# Patient Record
Sex: Female | Born: 1970
Health system: Southern US, Community
[De-identification: ages and names within clinical notes are randomized; demographics above are authoritative.]

## PROBLEM LIST (undated history)

## (undated) DIAGNOSIS — G43909 Migraine, unspecified, not intractable, without status migrainosus: Secondary | ICD-10-CM

## (undated) DIAGNOSIS — J189 Pneumonia, unspecified organism: Secondary | ICD-10-CM

## (undated) DIAGNOSIS — J449 Chronic obstructive pulmonary disease, unspecified: Secondary | ICD-10-CM

## (undated) DIAGNOSIS — J939 Pneumothorax, unspecified: Secondary | ICD-10-CM

## (undated) DIAGNOSIS — N200 Calculus of kidney: Secondary | ICD-10-CM

## (undated) HISTORY — PX: ABDOMINAL HYSTERECTOMY: SHX81

## (undated) HISTORY — PX: LUNG SURGERY: SHX703

---

## 2004-07-09 ENCOUNTER — Other Ambulatory Visit: Payer: Self-pay

## 2004-07-09 ENCOUNTER — Emergency Department: Payer: Self-pay | Admitting: Emergency Medicine

## 2004-07-18 ENCOUNTER — Ambulatory Visit: Payer: Self-pay | Admitting: Internal Medicine

## 2005-05-11 ENCOUNTER — Emergency Department: Payer: Self-pay | Admitting: Unknown Physician Specialty

## 2005-10-08 ENCOUNTER — Emergency Department: Payer: Self-pay | Admitting: Emergency Medicine

## 2005-11-23 ENCOUNTER — Emergency Department: Payer: Self-pay | Admitting: Emergency Medicine

## 2007-10-09 ENCOUNTER — Emergency Department: Payer: Self-pay | Admitting: Emergency Medicine

## 2008-01-11 ENCOUNTER — Emergency Department: Payer: Self-pay | Admitting: Emergency Medicine

## 2008-03-16 ENCOUNTER — Emergency Department: Payer: Self-pay | Admitting: Emergency Medicine

## 2008-03-20 ENCOUNTER — Emergency Department: Payer: Self-pay | Admitting: Emergency Medicine

## 2008-08-02 ENCOUNTER — Emergency Department: Payer: Self-pay | Admitting: Emergency Medicine

## 2010-01-05 ENCOUNTER — Emergency Department: Payer: Self-pay | Admitting: Emergency Medicine

## 2010-04-13 ENCOUNTER — Ambulatory Visit: Payer: Self-pay | Admitting: Family Medicine

## 2010-06-07 ENCOUNTER — Emergency Department: Payer: Self-pay | Admitting: Internal Medicine

## 2011-03-05 ENCOUNTER — Emergency Department: Payer: Self-pay | Admitting: Emergency Medicine

## 2011-05-06 ENCOUNTER — Emergency Department: Payer: Self-pay | Admitting: Emergency Medicine

## 2011-09-11 ENCOUNTER — Emergency Department: Payer: Self-pay | Admitting: Emergency Medicine

## 2011-09-11 LAB — URINALYSIS, COMPLETE
Glucose,UR: NEGATIVE mg/dL (ref 0–75)
Ketone: NEGATIVE
Nitrite: NEGATIVE
Ph: 5 (ref 4.5–8.0)
Protein: 100
RBC,UR: 50 /HPF (ref 0–5)
Specific Gravity: 1.02 (ref 1.003–1.030)
Squamous Epithelial: 13
Transitional Epi: 2
WBC UR: 1524 /HPF (ref 0–5)

## 2011-09-11 LAB — COMPREHENSIVE METABOLIC PANEL
Albumin: 4 g/dL (ref 3.4–5.0)
Alkaline Phosphatase: 76 U/L (ref 50–136)
Anion Gap: 7 (ref 7–16)
BUN: 9 mg/dL (ref 7–18)
Bilirubin,Total: 0.3 mg/dL (ref 0.2–1.0)
Calcium, Total: 8.9 mg/dL (ref 8.5–10.1)
Chloride: 107 mmol/L (ref 98–107)
Co2: 26 mmol/L (ref 21–32)
Creatinine: 0.69 mg/dL (ref 0.60–1.30)
EGFR (African American): >60
EGFR (Non-African Amer.): >60
Glucose: 79 mg/dL (ref 65–99)
Osmolality: 277 (ref 275–301)
Potassium: 3.6 mmol/L (ref 3.5–5.1)
SGOT(AST): 22 U/L (ref 15–37)
SGPT (ALT): 13 U/L
Sodium: 140 mmol/L (ref 136–145)
Total Protein: 7.3 g/dL (ref 6.4–8.2)

## 2011-09-11 LAB — LIPASE, BLOOD: Lipase: 74 U/L (ref 73–393)

## 2011-09-11 LAB — CBC
HCT: 37.8 % (ref 35.0–47.0)
HGB: 12.3 g/dL (ref 12.0–16.0)
MCH: 33.2 pg (ref 26.0–34.0)
MCHC: 32.6 g/dL (ref 32.0–36.0)
MCV: 102 fL — ABNORMAL HIGH (ref 80–100)
Platelet: 232 10*3/uL (ref 150–440)
RBC: 3.71 10*6/uL — ABNORMAL LOW (ref 3.80–5.20)
RDW: 13.3 % (ref 11.5–14.5)
WBC: 7.5 10*3/uL (ref 3.6–11.0)

## 2011-09-11 LAB — TROPONIN I: Troponin-I: <0.02 ng/mL

## 2011-09-11 LAB — PREGNANCY, URINE: Pregnancy Test, Urine: NEGATIVE m[IU]/mL

## 2011-09-13 LAB — URINE CULTURE

## 2012-05-27 ENCOUNTER — Emergency Department: Payer: Self-pay | Admitting: Emergency Medicine

## 2012-06-02 ENCOUNTER — Emergency Department: Payer: Self-pay | Admitting: Emergency Medicine

## 2013-02-27 ENCOUNTER — Emergency Department: Payer: Self-pay | Admitting: Emergency Medicine

## 2013-09-08 ENCOUNTER — Emergency Department: Payer: Self-pay | Admitting: Emergency Medicine

## 2013-09-08 LAB — COMPREHENSIVE METABOLIC PANEL
Albumin: 4.2 g/dL (ref 3.4–5.0)
Alkaline Phosphatase: 86 U/L
Anion Gap: 2 — ABNORMAL LOW (ref 7–16)
BUN: 14 mg/dL (ref 7–18)
Bilirubin,Total: 0.3 mg/dL (ref 0.2–1.0)
Calcium, Total: 8.8 mg/dL (ref 8.5–10.1)
Chloride: 102 mmol/L (ref 98–107)
Co2: 31 mmol/L (ref 21–32)
Creatinine: 0.58 mg/dL — ABNORMAL LOW (ref 0.60–1.30)
EGFR (African American): >60
EGFR (Non-African Amer.): >60
Glucose: 95 mg/dL (ref 65–99)
Osmolality: 270 (ref 275–301)
Potassium: 3.9 mmol/L (ref 3.5–5.1)
SGOT(AST): 25 U/L (ref 15–37)
SGPT (ALT): 22 U/L (ref 12–78)
Sodium: 135 mmol/L — ABNORMAL LOW (ref 136–145)
Total Protein: 7.7 g/dL (ref 6.4–8.2)

## 2013-09-08 LAB — CBC
HCT: 41.3 % (ref 35.0–47.0)
HGB: 13.8 g/dL (ref 12.0–16.0)
MCH: 33.5 pg (ref 26.0–34.0)
MCHC: 33.4 g/dL (ref 32.0–36.0)
MCV: 100 fL (ref 80–100)
Platelet: 246 10*3/uL (ref 150–440)
RBC: 4.11 10*6/uL (ref 3.80–5.20)
RDW: 13.2 % (ref 11.5–14.5)
WBC: 8.1 10*3/uL (ref 3.6–11.0)

## 2013-09-08 LAB — LIPASE, BLOOD: Lipase: 94 U/L (ref 73–393)

## 2013-09-08 LAB — URINALYSIS, COMPLETE
Bacteria: NONE SEEN
Bilirubin,UR: NEGATIVE
Blood: NEGATIVE
Glucose,UR: NEGATIVE mg/dL (ref 0–75)
Hyaline Cast: 1
Leukocyte Esterase: NEGATIVE
Nitrite: NEGATIVE
Ph: 5 (ref 4.5–8.0)
Protein: NEGATIVE
RBC,UR: 4 /HPF (ref 0–5)
Specific Gravity: 1.029 (ref 1.003–1.030)
Squamous Epithelial: 2
WBC UR: 1 /HPF (ref 0–5)

## 2013-09-10 LAB — URINE CULTURE

## 2013-10-20 ENCOUNTER — Emergency Department: Payer: Self-pay | Admitting: Emergency Medicine

## 2013-12-07 ENCOUNTER — Emergency Department: Payer: Self-pay | Admitting: Emergency Medicine

## 2014-07-26 ENCOUNTER — Emergency Department: Payer: Self-pay | Admitting: Emergency Medicine

## 2014-12-31 ENCOUNTER — Emergency Department
Admission: EM | Admit: 2014-12-31 | Discharge: 2015-01-01 | Disposition: A | Discharge status: Home / Self Care | Payer: Self-pay | Attending: Emergency Medicine | Admitting: Emergency Medicine

## 2014-12-31 DIAGNOSIS — Y998 Other external cause status: Secondary | ICD-10-CM | POA: Insufficient documentation

## 2014-12-31 DIAGNOSIS — T63001A Toxic effect of unspecified snake venom, accidental (unintentional), initial encounter: Secondary | ICD-10-CM

## 2014-12-31 DIAGNOSIS — Y9389 Activity, other specified: Secondary | ICD-10-CM | POA: Insufficient documentation

## 2014-12-31 DIAGNOSIS — W5911XA Bitten by nonvenomous snake, initial encounter: Secondary | ICD-10-CM | POA: Insufficient documentation

## 2014-12-31 DIAGNOSIS — Y9289 Other specified places as the place of occurrence of the external cause: Secondary | ICD-10-CM | POA: Insufficient documentation

## 2014-12-31 DIAGNOSIS — S61432A Puncture wound without foreign body of left hand, initial encounter: Secondary | ICD-10-CM | POA: Insufficient documentation

## 2014-12-31 NOTE — ED Provider Notes (Signed)
[facility] Emergency Department Provider Note  ____________________________________________  Time seen:  12:10 AM  I have reviewed the triage vital signs and the nursing notes.   HISTORY  Chief Complaint Snake Bite     HPI Lindsay Friedman is a 44 y.o. female who suspects she was bitten by a snake this evening. Prior to arrival, she was outside of the trailer she lives in she dropped her cigarette. She reached down to pick it up and felt a strike on her hand. She pulled back immediately and reports she saw a snake move away quickly.  The patient was unable to identify what type of snake it may have been.  The patient notes that she has 2 small red areas on the dorsum of her left hand.    The patient became quite nervous after this event. She felt tightness in her chest.  Patient reports she had tetanus shot 2 years ago.  No past medical history on file.  There are no active problems to display for this patient.   No past surgical history on file.  No current outpatient prescriptions on file.  Allergies Review of patient's allergies indicates no known allergies.  No family history on file.  Social History Social History  Substance Use Topics  . Smoking status: Not on file  . Smokeless tobacco: Not on file  . Alcohol Use: Not on file    Review of Systems  Constitutional: Negative for fever. ENT: Negative for sore throat. Cardiovascular: Negative for chest pain. Respiratory: Negative for shortness of breath. Gastrointestinal: Negative for abdominal pain, vomiting and diarrhea. Genitourinary: Negative for dysuria. Musculoskeletal: No myalgias or injuries. Skin: Negative for rash. Neurological: Negative for headaches   10-point ROS otherwise negative.  ____________________________________________   PHYSICAL EXAM:  VITAL SIGNS: ED Triage Vitals  Enc Vitals Group     BP 12/31/14 2313 118/84 mmHg     Pulse Rate 12/31/14 2313 91   Resp 12/31/14 2313 20     Temp 12/31/14 2313 98.2 F (36.8 C)     Temp Source 12/31/14 2313 Oral     SpO2 12/31/14 2313 100 %     Weight 12/31/14 2313 110 lb (49.896 kg)     Height 12/31/14 2313 5\' 2"  (1.575 m)     Head Cir --      Peak Flow --      Pain Score --      Pain Loc --      Pain Edu? --      Excl. in GC? --     Constitutional: Alert and oriented. Well appearing and in no distress. ENT   Head: Normocephalic and atraumatic.   Nose: No congestion/rhinnorhea. Cardiovascular: Normal rate, regular rhythm, no murmur noted Respiratory:  Normal respiratory effort, no tachypnea.    Breath sounds are clear and equal bilaterally.  Gastrointestinal: Soft and nontender. No distention.  Back: No muscle spasm, no tenderness, no CVA tenderness. Musculoskeletal: No deformity noted. Nontender with normal range of motion in all extremities.  No noted edema, including no edema of the left hand. The left hand has full range of motion and, other than the 2 small breaks in skin noted in the skin exam, appears normal. Neurologic:  Normal speech and language. No gross focal neurologic deficits are appreciated.  Skin:  There are 2 small skin breaks on the dorsum of the left hand. These appear to be small fang marks or puncture marks that hole laterally, likely from the patient  quickly withdrawing her hand. There is no ongoing bleeding. Each.is about to millimeters in diameter. They are approximately 15 mm apart. There is a dime size area of swelling around these 2 skin breaks. There is no erythema. There is no other swelling to the dorsum of the hand. Psychiatric: Mood and affect are normal. Speech and behavior are normal.  ____________________________________________   ____________________________________________   INITIAL IMPRESSION / ASSESSMENT AND PLAN / ED COURSE  Pertinent labs & imaging results that were available during my care of the patient were reviewed by me and considered in my  medical decision making (see chart for details).  44 year old female with a likely snake bite to the dorsum of the left hand. Thebetween the likely fang marks is fairly small. The bite was extremely brief, with the patient withdrawing her hand quickly. There is minimal swelling just to the area where the thing marks are. I have a lower suspicion for envenomation. No antivenom is indicated at this time. I have reassured the patient. We will observe her  in the emergency department for 3 hours.  ----------------------------------------- 2:10 AM on 01/01/2015 -----------------------------------------  The patient's left hand appears well. There is no sign of envenomation. There is no swelling. The patient is eager to leave the emergency department this time. We have discussed precautions and reasons to return.     ____________________________________________   FINAL CLINICAL IMPRESSION(S) / ED DIAGNOSES  Final diagnoses:  Snake bite, accidental or unintentional, initial encounter   left hand    Darien Ramus, MD 01/01/15 [phone]

## 2014-12-31 NOTE — ED Notes (Signed)
Reports snake bite to left hand.

## 2015-01-01 NOTE — Discharge Instructions (Signed)
Your left hand does not appear to be having significant swelling. He preferred to leave a little bit before the end of our observation, but we discussed this and agreed you're at low risk and that he would return if he started to have swelling or other problems.  Snake Bite Snakes may be either venomous (containing poison) or nonvenomous (nonpoisonous). A nonvenomous snake bite will cause trauma or a wound to the skin and possibly the deeper tissues. A venomous snake will also cause a traumatic wound, but more importantly, it may have injected venom into the wound. Snake bite venom can be extremely serious and even deadly. One type of venom may cause major skin, tissue and muscle damage, and failure of normal blood clotting. This may cause extreme swelling and pain of the affected area. Another type of venom can affect the brain and nervous system and may cause death. The treatment for venomous snake bite may require the use of antivenom medicine. If you are unsure if your bite is from a venomous snake, you MUST seek immediate medical attention. YOU MIGHT NEED A TETANUS SHOT NOW IF:  You have no idea when you had the last one.  You have never had a tetanus shot before.  The bite broke your skin. If you need a tetanus shot, and you decide not to get one, there is a rare chance of getting tetanus. Sickness from tetanus can be serious. HOME CARE INSTRUCTIONS  A snake bit you and caused a skin wound. It may or may not have been venomous. If the snake was venomous, a small amount of venom may have been injected into your skin.  Keep the bite area clean and dry.  Keep the extremity elevated above the level of the heart for the next 48 hours.  Wash the bite area 3 times daily with soap and water or an antiseptic. Apply an adhesive or gauze bandage to the bite area.  If you develop blistering of any type at the site of the bite, protect the blisters from breaking. Do not attempt to open it.  If you  were given a tetanus shot, your arm may get swollen, red and warm at the shot site. This is a common response to the injection. SEEK IMMEDIATE MEDICAL CARE IF:   You develop symptoms of poisoning including increased pain, redness, swelling, blood blisters or purple spots in the bite area, nausea, vomiting, numbness, tingling, excessive sweating, breathing difficulty, blurred vision, feelings of lightheadedness, or feeling faint. If you develop symptoms of poisoning, you MUST seek immediate medical attention.  The bite becomes infected. Symptoms may include redness, swelling, pain, tenderness, pus, red streaks running from the wound, or an oral temperature above 102 F (38.9 C), not controlled by medicine.  Your condition or wound becomes worse. MAKE SURE YOU:   Understand these instructions.  Will watch your condition.  Will get help right away if you are not doing well or get worse. Document Released: 05/03/2000 Document Revised: 07/29/2011 Document Reviewed: 09/27/2009 [facility] Patient Information 2015 [place], [place]. This information is not intended to replace advice given to you by your health care provider. Make sure you discuss any questions you have with your health care provider.

## 2015-01-01 NOTE — ED Notes (Signed)
Pt. Going with family, pt. Knows to return if change in snake bite area.

## 2015-02-23 ENCOUNTER — Emergency Department: Payer: Medicaid Other

## 2015-02-23 ENCOUNTER — Encounter: Payer: Self-pay | Admitting: Emergency Medicine

## 2015-02-23 ENCOUNTER — Emergency Department
Admission: EM | Admit: 2015-02-23 | Discharge: 2015-02-23 | Disposition: A | Discharge status: Home / Self Care | Payer: Medicaid Other | Attending: Emergency Medicine | Admitting: Emergency Medicine

## 2015-02-23 DIAGNOSIS — F419 Anxiety disorder, unspecified: Secondary | ICD-10-CM | POA: Insufficient documentation

## 2015-02-23 DIAGNOSIS — G8929 Other chronic pain: Secondary | ICD-10-CM | POA: Diagnosis not present

## 2015-02-23 DIAGNOSIS — R05 Cough: Secondary | ICD-10-CM | POA: Insufficient documentation

## 2015-02-23 DIAGNOSIS — R0789 Other chest pain: Secondary | ICD-10-CM | POA: Diagnosis not present

## 2015-02-23 DIAGNOSIS — R0602 Shortness of breath: Secondary | ICD-10-CM | POA: Diagnosis present

## 2015-02-23 DIAGNOSIS — Z720 Tobacco use: Secondary | ICD-10-CM | POA: Diagnosis not present

## 2015-02-23 DIAGNOSIS — R059 Cough, unspecified: Secondary | ICD-10-CM

## 2015-02-23 HISTORY — DX: Pneumothorax, unspecified: J93.9

## 2015-02-23 HISTORY — DX: Pneumonia, unspecified organism: J18.9

## 2015-02-23 LAB — CBC WITH DIFFERENTIAL/PLATELET
Basophils Absolute: 0 10*3/uL (ref 0–0.1)
Basophils Relative: 0 %
Eosinophils Absolute: 0.1 10*3/uL (ref 0–0.7)
Eosinophils Relative: 1 %
HCT: 38 % (ref 35.0–47.0)
Hemoglobin: 12.9 g/dL (ref 12.0–16.0)
Lymphocytes Relative: 22 %
Lymphs Abs: 1.8 10*3/uL (ref 1.0–3.6)
MCH: 33.2 pg (ref 26.0–34.0)
MCHC: 34 g/dL (ref 32.0–36.0)
MCV: 97.7 fL (ref 80.0–100.0)
Monocytes Absolute: 0.5 10*3/uL (ref 0.2–0.9)
Monocytes Relative: 6 %
Neutro Abs: 5.9 10*3/uL (ref 1.4–6.5)
Neutrophils Relative %: 71 %
Platelets: 234 10*3/uL (ref 150–440)
RBC: 3.89 MIL/uL (ref 3.80–5.20)
RDW: 12.9 % (ref 11.5–14.5)
WBC: 8.2 10*3/uL (ref 3.6–11.0)

## 2015-02-23 LAB — BASIC METABOLIC PANEL
Anion gap: 5 (ref 5–15)
BUN: 8 mg/dL (ref 6–20)
CO2: 31 mmol/L (ref 22–32)
Calcium: 9.6 mg/dL (ref 8.9–10.3)
Chloride: 106 mmol/L (ref 101–111)
Creatinine, Ser: 0.84 mg/dL (ref 0.44–1.00)
GFR calc Af Amer: >60 mL/min (ref >60)
GFR calc non Af Amer: >60 mL/min (ref >60)
Glucose, Bld: 96 mg/dL (ref 65–99)
Potassium: 4.2 mmol/L (ref 3.5–5.1)
Sodium: 142 mmol/L (ref 135–145)

## 2015-02-23 LAB — TROPONIN I: Troponin I: <0.03 ng/mL (ref <0.031)

## 2015-02-23 MED ORDER — TRAMADOL HCL 50 MG PO TABS: 50.0000 mg | ORAL_TABLET | Freq: Four times a day (QID) | ORAL | Status: DC | PRN

## 2015-02-23 MED ORDER — KETOROLAC TROMETHAMINE 30 MG/ML IJ SOLN
15.0000 mg | Freq: Once | INTRAMUSCULAR | Status: AC
  Administered 2015-02-23: 15 mg via INTRAVENOUS
  Filled 2015-02-23: qty 1
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
## 2015-02-23 NOTE — Discharge Instructions (Signed)

## 2015-04-20 ENCOUNTER — Encounter: Payer: Self-pay | Admitting: Obstetrics and Gynecology

## 2015-10-13 ENCOUNTER — Encounter: Payer: Self-pay | Admitting: Emergency Medicine

## 2015-10-13 ENCOUNTER — Emergency Department
Admission: EM | Admit: 2015-10-13 | Discharge: 2015-10-13 | Disposition: A | Discharge status: Home / Self Care | Payer: Medicaid Other | Attending: Student | Admitting: Student

## 2015-10-13 ENCOUNTER — Emergency Department: Payer: Medicaid Other

## 2015-10-13 DIAGNOSIS — F17210 Nicotine dependence, cigarettes, uncomplicated: Secondary | ICD-10-CM | POA: Insufficient documentation

## 2015-10-13 DIAGNOSIS — M25532 Pain in left wrist: Secondary | ICD-10-CM | POA: Diagnosis not present

## 2015-10-13 MED ORDER — NAPROXEN 500 MG PO TABS: 500.0000 mg | ORAL_TABLET | Freq: Two times a day (BID) | ORAL | Status: AC

## 2015-10-13 MED ORDER — TRAMADOL HCL 50 MG PO TABS: 50.0000 mg | ORAL_TABLET | Freq: Four times a day (QID) | ORAL | Status: DC | PRN
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
## 2016-04-11 ENCOUNTER — Emergency Department: Payer: Medicaid Other

## 2016-04-11 ENCOUNTER — Encounter: Payer: Self-pay | Admitting: Intensive Care

## 2016-04-11 ENCOUNTER — Emergency Department
Admission: EM | Admit: 2016-04-11 | Discharge: 2016-04-11 | Disposition: A | Discharge status: Home / Self Care | Payer: Medicaid Other | Attending: Emergency Medicine | Admitting: Emergency Medicine

## 2016-04-11 DIAGNOSIS — Z79899 Other long term (current) drug therapy: Secondary | ICD-10-CM | POA: Insufficient documentation

## 2016-04-11 DIAGNOSIS — G43909 Migraine, unspecified, not intractable, without status migrainosus: Secondary | ICD-10-CM | POA: Diagnosis present

## 2016-04-11 DIAGNOSIS — Z791 Long term (current) use of non-steroidal anti-inflammatories (NSAID): Secondary | ICD-10-CM | POA: Insufficient documentation

## 2016-04-11 DIAGNOSIS — F17210 Nicotine dependence, cigarettes, uncomplicated: Secondary | ICD-10-CM | POA: Insufficient documentation

## 2016-04-11 DIAGNOSIS — G43809 Other migraine, not intractable, without status migrainosus: Secondary | ICD-10-CM | POA: Insufficient documentation

## 2016-04-11 DIAGNOSIS — R51 Headache: Secondary | ICD-10-CM

## 2016-04-11 DIAGNOSIS — R519 Headache, unspecified: Secondary | ICD-10-CM

## 2016-04-11 HISTORY — DX: Migraine, unspecified, not intractable, without status migrainosus: G43.909

## 2016-04-11 LAB — CBC
HCT: 42.4 % (ref 35.0–47.0)
Hemoglobin: 14.7 g/dL (ref 12.0–16.0)
MCH: 34.7 pg — ABNORMAL HIGH (ref 26.0–34.0)
MCHC: 34.6 g/dL (ref 32.0–36.0)
MCV: 100.2 fL — ABNORMAL HIGH (ref 80.0–100.0)
Platelets: 251 10*3/uL (ref 150–440)
RBC: 4.23 MIL/uL (ref 3.80–5.20)
RDW: 13.2 % (ref 11.5–14.5)
WBC: 9.7 10*3/uL (ref 3.6–11.0)

## 2016-04-11 LAB — BASIC METABOLIC PANEL
Anion gap: 9 (ref 5–15)
BUN: 11 mg/dL (ref 6–20)
CO2: 27 mmol/L (ref 22–32)
Calcium: 9.6 mg/dL (ref 8.9–10.3)
Chloride: 104 mmol/L (ref 101–111)
Creatinine, Ser: 0.67 mg/dL (ref 0.44–1.00)
GFR calc Af Amer: >60 mL/min (ref >60)
GFR calc non Af Amer: >60 mL/min (ref >60)
Glucose, Bld: 96 mg/dL (ref 65–99)
Potassium: 3.8 mmol/L (ref 3.5–5.1)
Sodium: 140 mmol/L (ref 135–145)

## 2016-04-11 LAB — URINE DRUG SCREEN, QUALITATIVE (ARMC ONLY)
Amphetamines, Ur Screen: NOT DETECTED
Barbiturates, Ur Screen: POSITIVE — AB
Benzodiazepine, Ur Scrn: NOT DETECTED
Cannabinoid 50 Ng, Ur ~~LOC~~: POSITIVE — AB
Cocaine Metabolite,Ur ~~LOC~~: NOT DETECTED
MDMA (Ecstasy)Ur Screen: NOT DETECTED
Methadone Scn, Ur: NOT DETECTED
Opiate, Ur Screen: POSITIVE — AB
Phencyclidine (PCP) Ur S: NOT DETECTED
Tricyclic, Ur Screen: NOT DETECTED

## 2016-04-11 LAB — PREGNANCY, URINE: Preg Test, Ur: NEGATIVE

## 2016-04-11 MED ORDER — SODIUM CHLORIDE 0.9 % IV BOLUS (SEPSIS)
1000.0000 mL | Freq: Once | INTRAVENOUS | Status: AC
  Administered 2016-04-11: 1000 mL via INTRAVENOUS

## 2016-04-11 MED ORDER — METOCLOPRAMIDE HCL 5 MG/ML IJ SOLN
10.0000 mg | Freq: Once | INTRAMUSCULAR | Status: AC
  Administered 2016-04-11: 10 mg via INTRAVENOUS
  Filled 2016-04-11: qty 2

## 2016-04-11 MED ORDER — KETOROLAC TROMETHAMINE 30 MG/ML IJ SOLN
30.0000 mg | Freq: Once | INTRAMUSCULAR | Status: AC
  Administered 2016-04-11: 30 mg via INTRAVENOUS
  Filled 2016-04-11: qty 1

## 2016-04-11 MED ORDER — MAGNESIUM SULFATE 2 GM/50ML IV SOLN
2.0000 g | Freq: Once | INTRAVENOUS | Status: AC
  Administered 2016-04-11: 2 g via INTRAVENOUS
  Filled 2016-04-11: qty 50

## 2016-04-11 NOTE — ED Triage Notes (Signed)
PAtient presents to ER today with c/o migraine, R sided weakness in arm and leg, and blurry vision through R eye. Patient unsure when symptoms started. [facility]. Denies chest pain or SOB at this time. Patient ambulatory back to ER room 24. Pt A&O x4.

## 2016-04-11 NOTE — ED Notes (Signed)
Pt verbalized understanding of discharge instructions. NAD at this time. 

## 2016-04-11 NOTE — ED Notes (Signed)
Patient ambulated to restroom with no difficulty and with steady gait.

## 2016-04-11 NOTE — Discharge Instructions (Signed)
Follow up with Neurology as recommended.   You have been seen in the Emergency Department (ED) for a headache. Your evaluation today was overall reassuring. Headaches have many possible causes. Most headaches aren't a sign of a more serious problem, and they will get better on their own.   Follow-up with your doctor in 12-24 hours if you are still having a headache. Otherwise follow up with your doctor in 3-5 days.  For pain take fioricet, tylenol or ibuprofen  When should you call for help?  Call 911 or return to the ED anytime you think you may need emergency care. For example, call if:  You have signs of a stroke. These may include:  Sudden numbness, paralysis, or weakness in your face, arm, or leg, especially on only one side of your body.  Sudden vision changes.  Sudden trouble speaking.  Sudden confusion or trouble understanding simple statements.  Sudden problems with walking or balance.  A sudden, severe headache that is different from past headaches. You have new or worsening headache Nausea and vomiting associated with your headache Fever, neck stiffness associated with your headache  Call your doctor now or seek immediate medical care if:  You have a new or worse headache.  Your headache gets much worse.  How can you care for yourself at home?  Do not drive if you have taken a prescription pain medicine.  Rest in a quiet, dark room until your headache is gone. Close your eyes and try to relax or go to sleep. Don't watch TV or read.  Put a cold, moist cloth or cold pack on the painful area for 10 to 20 minutes at a time. Put a thin cloth between the cold pack and your skin.  Use a warm, moist towel or a heating pad set on low to relax tight shoulder and neck muscles.  Have someone gently massage your neck and shoulders.  Take pain medicines exactly as directed.  If the doctor gave you a prescription medicine for pain, take it as prescribed.  If you are not taking a  prescription pain medicine, ask your doctor if you can take an over-the-counter medicine. Be careful not to take pain medicine more often than the instructions allow, because you may get worse or more frequent headaches when the medicine wears off.  Do not ignore new symptoms that occur with a headache, such as a fever, weakness or numbness, vision changes, or confusion. These may be signs of a more serious problem.  To prevent headaches  Keep a headache diary so you can figure out what triggers your headaches. Avoiding triggers may help you prevent headaches. Record when each headache began, how long it lasted, and what the pain was like (throbbing, aching, stabbing, or dull). Write down any other symptoms you had with the headache, such as nausea, flashing lights or dark spots, or sensitivity to bright light or loud noise. Note if the headache occurred near your period. List anything that might have triggered the headache, such as certain foods (chocolate, cheese, wine) or odors, smoke, bright light, stress, or lack of sleep.  Find healthy ways to deal with stress. Headaches are most common during or right after stressful times. Take time to relax before and after you do something that has caused a headache in the past.  Try to keep your muscles relaxed by keeping good posture. Check your jaw, face, neck, and shoulder muscles for tension, and try relaxing them. When sitting at a desk, change  positions often, and stretch for 30 seconds each hour.  Get plenty of sleep and exercise.  Eat regularly and well. Long periods without food can trigger a headache.  Treat yourself to a massage. Some people find that regular massages are very helpful in relieving tension.  Limit caffeine by not drinking too much coffee, tea, or soda. But don't quit caffeine suddenly, because that can also give you headaches.  Reduce eyestrain from computers by blinking frequently and looking away from the computer screen every so  often. Make sure you have proper eyewear and that your monitor is set up properly, about an arm's length away.  Seek help if you have depression or anxiety. Your headaches may be linked to these conditions. Treatment can both prevent headaches and help with symptoms of anxiety or depression.

## 2016-04-11 NOTE — ED Provider Notes (Signed)
[facility] Emergency Department Provider Note  ____________________________________________  Time seen: Approximately 12:48 PM  I have reviewed the triage vital signs and the nursing notes.   HISTORY  Chief Complaint Migraine   HPI Lindsay Friedman is a 45 y.o. female history of chronic migraines presents for evaluation of a headache. Patient reports a week of constant right-sided severe sharp stabbing headache. She was seen at [facility] [place] 3 days ago with a negative CT scan of the head and was referred to neurology. Patient reports that this morning at 3 AM she woke up with the same severe headache but it was also having weakness on her right side which was new for her. She denies ever having any neurological deficits with her prior migraines. She also endorses blurry vision that has been going on for 1 week. She reports that the blurry vision is located on the right eye and present even when the headache is not present. Patient denies drug use, alcohol use. She is a smoker. No family history of stroke. HA is currently 10/10. Patient does not take exogenous hormones. She has no personal or family history of clotting disorder. Patient was last normal with no neurological deficits yesterday when she went to bed.  Past Medical History:  Diagnosis Date  . Migraines   . Pneumonia   . Pneumothorax     There are no active problems to display for this patient.   Past Surgical History:  Procedure Laterality Date  . ABDOMINAL HYSTERECTOMY    . LUNG SURGERY      Prior to Admission medications   Medication Sig Start Date End Date Taking? Authorizing Provider  naproxen (NAPROSYN) 500 MG tablet Take 1 tablet (500 mg total) by mouth 2 (two) times daily with a meal. 10/13/15 10/12/16  Chinita Pesterari B Triplett, FNP  traMADol (ULTRAM) 50 MG tablet Take 1 tablet (50 mg total) by mouth every 6 (six) hours as needed. 10/13/15   Chinita Pesterari B Triplett, FNP    Allergies Patient has no known  allergies.  History reviewed. No pertinent family history.  Social History Social History  Substance Use Topics  . Smoking status: Current Every Day Smoker    Packs/day: 0.50    Types: Cigarettes  . Smokeless tobacco: Never Used  . Alcohol use No    Review of Systems  Constitutional: Negative for fever. Eyes: + R sided blurry vision ENT: Negative for sore throat. Neck: No neck pain  Cardiovascular: Negative for chest pain. Respiratory: Negative for shortness of breath. Gastrointestinal: Negative for abdominal pain, vomiting or diarrhea. Genitourinary: Negative for dysuria. Musculoskeletal: Negative for back pain. Skin: Negative for rash. Neurological: + HA, R sided weakness Psych: No SI or HI  ____________________________________________   PHYSICAL EXAM:  VITAL SIGNS:   RR 11 -- 96 % -- HR 73 Monitor -- 119/80 Automatic   Constitutional: Alert and oriented. Well appearing and in no apparent distress. HEENT:      Head: Normocephalic and atraumatic.         Eyes: Conjunctivae are normal. Sclera is non-icteric. EOMI. PERRL      Mouth/Throat: Mucous membranes are moist.       Neck: Supple with no signs of meningismus. Cardiovascular: Regular rate and rhythm. No murmurs, gallops, or rubs. 2+ symmetrical distal pulses are present in all extremities. No JVD. Respiratory: Normal respiratory effort. Lungs are clear to auscultation bilaterally. No wheezes, crackles, or rhonchi.  Gastrointestinal: Soft, non tender, and non distended with positive bowel sounds. No  rebound or guarding. Musculoskeletal: Nontender with normal range of motion in all extremities. No edema, cyanosis, or erythema of extremities. Neurologic: Normal speech and language. A & O x3, PERRL, no nystagmus, CN II-XII intact, motor testing reveals good tone and bulk throughout. There is no evidence of pronator drift or dysmetria. Muscle strength is 4+/5 on RUE, 5-/5 RLE, 5/5 on the left. Deep tendon reflexes are  2+ throughout with downgoing toes. Sensory examination is intact. Gait is normal. Skin: Skin is warm, dry and intact. No rash noted. Psychiatric: Mood and affect are normal. Speech and behavior are normal.  ____________________________________________   LABS (all labs ordered are listed, but only abnormal results are displayed)  Labs Reviewed  URINE DRUG SCREEN, QUALITATIVE ([facility] ONLY) - Abnormal; Notable for the following:       Result Value   Opiate, Ur Screen POSITIVE (*)    Cannabinoid 50 Ng, Ur [place] POSITIVE (*)    Barbiturates, Ur Screen POSITIVE (*)    All other components within normal limits  CBC - Abnormal; Notable for the following:    MCV 100.2 (*)    MCH 34.7 (*)    All other components within normal limits  BASIC METABOLIC PANEL  PREGNANCY, URINE   ____________________________________________  EKG  ED ECG REPORT I, Nita Sicklearolina Keimora Swartout, the attending physician, personally viewed and interpreted this ECG.  Normal sinus rhythm, rate of 83, normal intervals, normal axis, no ST elevations or depressions, T-wave inversions in aVL and V2. ____________________________________________  RADIOLOGY  Head CT: negative  MRI/MRA/MRV: 1. Negative motion degraded brain MRI. No explanation for symptoms. 2. Normal intracranial MRA and MRV. ____________________________________________   PROCEDURES  Procedure(s) performed: None Procedures Critical Care performed:  None ____________________________________________   INITIAL IMPRESSION / ASSESSMENT AND PLAN / ED COURSE  45 y.o. female history of chronic migraines presents for evaluation of 1 week of R sided headache associated with blurry vision and now with R sided weakness and numbness since 3AM. Patient is well-appearing, in mild distress due to the pain, neuro exam shows 4+/5 strength on right upper extremity and 5-/5 on right lower extremity with no dysmetria, no pronator drift, normal reflexes, normal extraocular  movements. Plan for head CT if that's negative to pursue MRI MRV to rule out CVT versus acute ischemic stroke.   Clinical Course as of Apr 12 1523  Thu Apr 11, 2016  1353 CT head negative. Patient's UDS positive for opiates, MJ, and barbiturates. Patient denied drug use. MRI pending.  [CV]    Clinical Course User Index [CV] Nita Sicklearolina Donyelle Enyeart, MD   MRI/MRV negative. Patient's pain is improved after meds. Will dc home with f.u with neurology.   Pertinent labs & imaging results that were available during my care of the patient were reviewed by me and considered in my medical decision making (see chart for details).    ____________________________________________   FINAL CLINICAL IMPRESSION(S) / ED DIAGNOSES  Final diagnoses:  Headache  Other migraine without status migrainosus, not intractable      NEW MEDICATIONS STARTED DURING THIS VISIT:  New Prescriptions   No medications on file     Note:  This document was prepared using Dragon voice recognition software and may include unintentional dictation errors.    Nita Sicklearolina Fabrice Dyal, MD 04/14/16 [id number]

## 2016-09-30 ENCOUNTER — Encounter: Payer: Self-pay | Admitting: Emergency Medicine

## 2016-09-30 ENCOUNTER — Emergency Department
Admission: EM | Admit: 2016-09-30 | Discharge: 2016-09-30 | Disposition: A | Discharge status: Home / Self Care | Payer: Medicaid Other | Attending: Emergency Medicine | Admitting: Emergency Medicine

## 2016-09-30 ENCOUNTER — Emergency Department: Payer: Medicaid Other

## 2016-09-30 DIAGNOSIS — F17210 Nicotine dependence, cigarettes, uncomplicated: Secondary | ICD-10-CM | POA: Insufficient documentation

## 2016-09-30 DIAGNOSIS — R109 Unspecified abdominal pain: Secondary | ICD-10-CM

## 2016-09-30 DIAGNOSIS — R350 Frequency of micturition: Secondary | ICD-10-CM | POA: Insufficient documentation

## 2016-09-30 DIAGNOSIS — R1011 Right upper quadrant pain: Secondary | ICD-10-CM

## 2016-09-30 DIAGNOSIS — R112 Nausea with vomiting, unspecified: Secondary | ICD-10-CM | POA: Diagnosis not present

## 2016-09-30 DIAGNOSIS — Z79899 Other long term (current) drug therapy: Secondary | ICD-10-CM | POA: Diagnosis not present

## 2016-09-30 LAB — CBC
HCT: 38.9 % (ref 35.0–47.0)
Hemoglobin: 13.3 g/dL (ref 12.0–16.0)
MCH: 33.7 pg (ref 26.0–34.0)
MCHC: 34.2 g/dL (ref 32.0–36.0)
MCV: 98.6 fL (ref 80.0–100.0)
Platelets: 245 10*3/uL (ref 150–440)
RBC: 3.95 MIL/uL (ref 3.80–5.20)
RDW: 13.7 % (ref 11.5–14.5)
WBC: 11.8 10*3/uL — ABNORMAL HIGH (ref 3.6–11.0)

## 2016-09-30 LAB — URINALYSIS, COMPLETE (UACMP) WITH MICROSCOPIC
Bacteria, UA: NONE SEEN
Bilirubin Urine: NEGATIVE
Glucose, UA: NEGATIVE mg/dL
Hgb urine dipstick: NEGATIVE
Ketones, ur: NEGATIVE mg/dL
Leukocytes, UA: NEGATIVE
Nitrite: NEGATIVE
Protein, ur: NEGATIVE mg/dL
Specific Gravity, Urine: 1.002 — ABNORMAL LOW (ref 1.005–1.030)
Squamous Epithelial / LPF: NONE SEEN
pH: 7 (ref 5.0–8.0)

## 2016-09-30 LAB — LIPASE, BLOOD: Lipase: 17 U/L (ref 11–51)

## 2016-09-30 LAB — COMPREHENSIVE METABOLIC PANEL
ALT: 13 U/L — ABNORMAL LOW (ref 14–54)
AST: 25 U/L (ref 15–41)
Albumin: 4.2 g/dL (ref 3.5–5.0)
Alkaline Phosphatase: 56 U/L (ref 38–126)
Anion gap: 8 (ref 5–15)
BUN: 7 mg/dL (ref 6–20)
CO2: 27 mmol/L (ref 22–32)
Calcium: 9.6 mg/dL (ref 8.9–10.3)
Chloride: 103 mmol/L (ref 101–111)
Creatinine, Ser: 0.75 mg/dL (ref 0.44–1.00)
GFR calc Af Amer: >60 mL/min (ref >60)
GFR calc non Af Amer: >60 mL/min (ref >60)
Glucose, Bld: 89 mg/dL (ref 65–99)
Potassium: 3.5 mmol/L (ref 3.5–5.1)
Sodium: 138 mmol/L (ref 135–145)
Total Bilirubin: 0.4 mg/dL (ref 0.3–1.2)
Total Protein: 7.4 g/dL (ref 6.5–8.1)

## 2016-09-30 MED ORDER — DICYCLOMINE HCL 20 MG PO TABS
20.0000 mg | ORAL_TABLET | Freq: Three times a day (TID) | ORAL | 0 refills | Status: DC | PRN
Start: 2016-09-30 — End: 2018-10-12

## 2016-09-30 MED ORDER — ONDANSETRON 4 MG PO TBDP
4.0000 mg | ORAL_TABLET | Freq: Once | ORAL | Status: AC | PRN
  Administered 2016-09-30: 4 mg via ORAL
  Filled 2016-09-30: qty 1

## 2016-09-30 MED ORDER — ONDANSETRON HCL 8 MG PO TABS: 8.0000 mg | ORAL_TABLET | Freq: Three times a day (TID) | ORAL | 0 refills | Status: DC | PRN

## 2016-09-30 NOTE — ED Triage Notes (Signed)
Patient presents to the ED with right upper quadrant pain that she states became much worse last night.  Patient reports pain feels like being stabbed.  Patient holding and guarding ruq.  Patient reports difficulty urinating during the day but reports frequent urination at night.  Patient reports vomiting x 2.  Patient denies diarrhea.

## 2016-09-30 NOTE — ED Provider Notes (Signed)
[facility] Emergency Department Provider Note   ____________________________________________   First MD Initiated Contact with Patient 09/30/16 1346     (approximate)  I have reviewed the triage vital signs and the nursing notes.   HISTORY  Chief Complaint Abdominal Pain    HPI Lindsay Friedman is a 46 y.o. female patient complaining of right upper quadrant abdominal pain that began last night. Patient described a pain as "stabbing". Patient denies back pain with this complaint. Patient also reported difficulty with urination but increased nocturnal frequency. Patient state nodule. 2 vomiting episodes. Patient denies diarrhea or constipation. No palliative measures taken for her complaints. Patient rates the pain as a 9/10.   Past Medical History:  Diagnosis Date  . Migraines   . Pneumonia   . Pneumothorax     There are no active problems to display for this patient.   Past Surgical History:  Procedure Laterality Date  . ABDOMINAL HYSTERECTOMY    . LUNG SURGERY      Prior to Admission medications   Medication Sig Start Date End Date Taking? Authorizing Provider  dicyclomine (BENTYL) 20 MG tablet Take 1 tablet (20 mg total) by mouth 3 (three) times daily as needed for spasms. 09/30/16 09/30/17  Joni ReiningSmith, Ronald K, PA-C  naproxen (NAPROSYN) 500 MG tablet Take 1 tablet (500 mg total) by mouth 2 (two) times daily with a meal. 10/13/15 10/12/16  Triplett, Cari B, FNP  ondansetron (ZOFRAN) 8 MG tablet Take 1 tablet (8 mg total) by mouth every 8 (eight) hours as needed for nausea or vomiting. 09/30/16   Joni ReiningSmith, Ronald K, PA-C  traMADol (ULTRAM) 50 MG tablet Take 1 tablet (50 mg total) by mouth every 6 (six) hours as needed. 10/13/15   Chinita Pesterriplett, Cari B, FNP    Allergies Patient has no known allergies.  No family history on file.  Social History Social History  Substance Use Topics  . Smoking status: Current Every Day Smoker    Packs/day: 0.50    Types:  Cigarettes  . Smokeless tobacco: Never Used  . Alcohol use No    Review of Systems  Constitutional: No fever/chills Eyes: No visual changes. ENT: No sore throat. Cardiovascular: Denies chest pain. Respiratory: Denies shortness of breath. Gastrointestinal: Right upper abdominal pain.   Nausea with vomiting.  No diarrhea.  No constipation. Genitourinary: Urinary frequency . Musculoskeletal: Negative for back pain. Skin: Negative for rash. Neurological: Negative for headaches, focal weakness or numbness.   ____________________________________________   PHYSICAL EXAM:  VITAL SIGNS: ED Triage Vitals  Enc Vitals Group     BP 09/30/16 1247 114/73     Pulse Rate 09/30/16 1247 82     Resp 09/30/16 1247 18     Temp 09/30/16 1247 97.9 F (36.6 C)     Temp Source 09/30/16 1247 Oral     SpO2 09/30/16 1247 100 %     Weight 09/30/16 1248 110 lb (49.9 kg)     Height 09/30/16 1248 5\' 2"  (1.575 m)     Head Circumference --      Peak Flow --      Pain Score 09/30/16 1247 9     Pain Loc --      Pain Edu? --      Excl. in GC? --     Constitutional: Alert and oriented. Well appearing and in no acute distress. Eyes: Conjunctivae are normal. PERRL. EOMI. Head: Atraumatic. Nose: No congestion/rhinnorhea. Mouth/Throat: Mucous membranes are moist.  Oropharynx non-erythematous.  Neck: No stridor.  No cervical spine tenderness to palpation. Hematological/Lymphatic/Immunilogical: No cervical lymphadenopathy. Cardiovascular: Normal rate, regular rhythm. Grossly normal heart sounds.  Good peripheral circulation. Respiratory: Normal respiratory effort.  No retractions. Lungs CTAB. Gastrointestinal: Soft and nontender. No distention. No abdominal bruits. No CVA tenderness. Musculoskeletal: No lower extremity tenderness nor edema.  No joint effusions. Neurologic:  Normal speech and language. No gross focal neurologic deficits are appreciated. No gait instability. Skin:  Skin is warm, dry and  intact. No rash noted. Psychiatric: Mood and affect are normal. Speech and behavior are normal.  ____________________________________________   LABS (all labs ordered are listed, but only abnormal results are displayed)  Labs Reviewed  COMPREHENSIVE METABOLIC PANEL - Abnormal; Notable for the following:       Result Value   ALT 13 (*)    All other components within normal limits  CBC - Abnormal; Notable for the following:    WBC 11.8 (*)    All other components within normal limits  URINALYSIS, COMPLETE (UACMP) WITH MICROSCOPIC - Abnormal; Notable for the following:    Color, Urine STRAW (*)    APPearance CLEAR (*)    Specific Gravity, Urine 1.002 (*)    All other components within normal limits  LIPASE, BLOOD   ____________________________________________  EKG   ____________________________________________  RADIOLOGY  Ultrasound of the right upper quadrant abdomen reveals no acute findings. ___________________________________________   PROCEDURES  Procedure(s) performed: None  Procedures  Critical Care performed: No  ____________________________________________   INITIAL IMPRESSION / ASSESSMENT AND PLAN / ED COURSE  Pertinent labs & imaging results that were available during my care of the patient were reviewed by me and considered in my medical decision making (see chart for details).  Nonspecific right upper quadrant abdominal pain. Discussed negative ultrasound and lab results with patient. Patient advised follow-up with gastroenterologist for definitive evaluation and treatment.      ____________________________________________   FINAL CLINICAL IMPRESSION(S) / ED DIAGNOSES  Final diagnoses:  Abdominal pain, acute, right upper quadrant  Nonspecific abdominal pain      NEW MEDICATIONS STARTED DURING THIS VISIT:  New Prescriptions   DICYCLOMINE (BENTYL) 20 MG TABLET    Take 1 tablet (20 mg total) by mouth 3 (three) times daily as needed for  spasms.   ONDANSETRON (ZOFRAN) 8 MG TABLET    Take 1 tablet (8 mg total) by mouth every 8 (eight) hours as needed for nausea or vomiting.     Note:  This document was prepared using Dragon voice recognition software and may include unintentional dictation errors.    Filicia, Scogin, PA-C 09/30/16 1514    Nita Sickle, MD 10/01/16 [phone]

## 2016-09-30 NOTE — ED Notes (Signed)
See triage note states she developed upper abd pain last pm   Positive n/v  No fever   Pt is very tearful on arrival

## 2016-09-30 NOTE — Discharge Instructions (Signed)
No acute findings for your complaining of abdominal pain. Heart sounds and asthma unremarkable. Advised to follow-up with gastroenterologist for definitive evaluation and treatment.

## 2017-03-19 ENCOUNTER — Emergency Department
Admission: EM | Admit: 2017-03-19 | Discharge: 2017-03-19 | Disposition: A | Discharge status: Home / Self Care | Payer: Medicaid Other | Attending: Emergency Medicine | Admitting: Emergency Medicine

## 2017-03-19 DIAGNOSIS — G43909 Migraine, unspecified, not intractable, without status migrainosus: Secondary | ICD-10-CM | POA: Diagnosis not present

## 2017-03-19 DIAGNOSIS — R51 Headache: Secondary | ICD-10-CM | POA: Diagnosis present

## 2017-03-19 DIAGNOSIS — R55 Syncope and collapse: Secondary | ICD-10-CM

## 2017-03-19 DIAGNOSIS — K047 Periapical abscess without sinus: Secondary | ICD-10-CM | POA: Insufficient documentation

## 2017-03-19 DIAGNOSIS — F17210 Nicotine dependence, cigarettes, uncomplicated: Secondary | ICD-10-CM | POA: Diagnosis not present

## 2017-03-19 DIAGNOSIS — G43009 Migraine without aura, not intractable, without status migrainosus: Secondary | ICD-10-CM

## 2017-03-19 LAB — COMPREHENSIVE METABOLIC PANEL
ALT: 13 U/L — ABNORMAL LOW (ref 14–54)
AST: 23 U/L (ref 15–41)
Albumin: 4.4 g/dL (ref 3.5–5.0)
Alkaline Phosphatase: 70 U/L (ref 38–126)
Anion gap: 11 (ref 5–15)
BUN: 6 mg/dL (ref 6–20)
CO2: 25 mmol/L (ref 22–32)
Calcium: 10.2 mg/dL (ref 8.9–10.3)
Chloride: 105 mmol/L (ref 101–111)
Creatinine, Ser: 0.62 mg/dL (ref 0.44–1.00)
GFR calc Af Amer: >60 mL/min (ref >60)
GFR calc non Af Amer: >60 mL/min (ref >60)
Glucose, Bld: 95 mg/dL (ref 65–99)
Potassium: 3.9 mmol/L (ref 3.5–5.1)
Sodium: 141 mmol/L (ref 135–145)
Total Bilirubin: 0.6 mg/dL (ref 0.3–1.2)
Total Protein: 7.8 g/dL (ref 6.5–8.1)

## 2017-03-19 LAB — CBC
HCT: 37.9 % (ref 35.0–47.0)
Hemoglobin: 13.3 g/dL (ref 12.0–16.0)
MCH: 34.6 pg — ABNORMAL HIGH (ref 26.0–34.0)
MCHC: 35 g/dL (ref 32.0–36.0)
MCV: 98.6 fL (ref 80.0–100.0)
Platelets: 249 10*3/uL (ref 150–440)
RBC: 3.84 MIL/uL (ref 3.80–5.20)
RDW: 12.9 % (ref 11.5–14.5)
WBC: 11.1 10*3/uL — ABNORMAL HIGH (ref 3.6–11.0)

## 2017-03-19 LAB — URINALYSIS, ROUTINE W REFLEX MICROSCOPIC
Bilirubin Urine: NEGATIVE
Glucose, UA: NEGATIVE mg/dL
Hgb urine dipstick: NEGATIVE
Ketones, ur: NEGATIVE mg/dL
Leukocytes, UA: NEGATIVE
Nitrite: NEGATIVE
Protein, ur: NEGATIVE mg/dL
Specific Gravity, Urine: 1.005 (ref 1.005–1.030)
pH: 6 (ref 5.0–8.0)

## 2017-03-19 LAB — TROPONIN I: Troponin I: <0.03 ng/mL (ref <0.03)

## 2017-03-19 LAB — POCT PREGNANCY, URINE: Preg Test, Ur: NEGATIVE

## 2017-03-19 MED ORDER — PENICILLIN V POTASSIUM 500 MG PO TABS: 500.0000 mg | ORAL_TABLET | Freq: Four times a day (QID) | ORAL | 0 refills | Status: DC

## 2017-03-19 MED ORDER — KETOROLAC TROMETHAMINE 30 MG/ML IJ SOLN
30.0000 mg | Freq: Once | INTRAMUSCULAR | Status: AC
  Administered 2017-03-19: 30 mg via INTRAVENOUS
  Filled 2017-03-19: qty 1

## 2017-03-19 MED ORDER — DIPHENHYDRAMINE HCL 50 MG/ML IJ SOLN
50.0000 mg | Freq: Once | INTRAMUSCULAR | Status: AC
  Administered 2017-03-19: 50 mg via INTRAVENOUS
  Filled 2017-03-19: qty 1

## 2017-03-19 MED ORDER — METOCLOPRAMIDE HCL 5 MG/ML IJ SOLN
10.0000 mg | Freq: Once | INTRAMUSCULAR | Status: AC
  Administered 2017-03-19: 10 mg via INTRAVENOUS
  Filled 2017-03-19: qty 2

## 2017-03-19 MED ORDER — SODIUM CHLORIDE 0.9 % IV BOLUS (SEPSIS)
1000.0000 mL | Freq: Once | INTRAVENOUS | Status: AC
  Administered 2017-03-19: 1000 mL via INTRAVENOUS

## 2017-03-19 NOTE — Discharge Instructions (Signed)
OPTIONS FOR DENTAL FOLLOW UP CARE ° °[place] Department of Health and Human Services - Local Safety Net Dental Clinics °http://www.ncdhhs.gov/dph/oralhealth/services/safetynetclinics.htm °  °[facility] (336-562-3123) ° °[facility] [place] (919-933-9087) ° °[facility] ([phone] ext [phone]) ° °[facility] ([phone]) ° °[facility] ([phone]) °This clinic caters to the indigent population and is on a lottery system. °Location: °[facility], [address], [place] °Clinic Hours: °Wednesdays from 6pm - 9pm, patients seen by a lottery system. °For dates, call or go to [email or web address].[facility].edu/[facility]/patients/Dental-[facility] °Services: °Cleanings, fillings and simple extractions. °Payment Options: °DENTAL WORK IS FREE OF CHARGE. Bring proof of income or support. °Best way to get seen: °Arrive at 5:15 pm - this is a lottery, NOT first come/first serve, so arriving earlier will not increase your chances of being seen. °  °  °[facility] °919-537-3737 °Select option 1 for emergencies °  °Location: °[facility], [address], [place] °Clinic Hours: °No walk-ins accepted - call the day before to schedule an appointment. °Check in times are 9:30 am and 1:30 pm. °Services: °Simple extractions, temporary fillings, pulpectomy/pulp debridement, uncomplicated abscess drainage. °Payment Options: °PAYMENT IS DUE AT THE TIME OF SERVICE.  Fee is usually $100-200, additional surgical procedures (e.g. abscess drainage) may be extra. °Cash, checks, Visa/MasterCard accepted.  Can file Medicaid if patient is covered for dental - patient should call case worker to check. °No discount for [facility] patients. °Best way to get seen: °MUST call the day before and get onto the schedule. Can usually be seen the next 1-2 days. No walk-ins accepted. °  °  °[facility] °[phone] °   °Location: °[facility], [address], [place] °Clinic Hours: °M, W, Th, F 8am or 1:30pm, Tues 9a or 1:30 - first come/first served. °Services: °Simple extractions, temporary fillings, uncomplicated abscess drainage.  You do not need to be an [place] resident. °Payment Options: °PAYMENT IS DUE AT THE TIME OF SERVICE. °Dental insurance, otherwise sliding scale - bring proof of income or support. °Depending on income and treatment needed, cost is usually $50-200. °Best way to get seen: °Arrive early as it is first come/first served. °  °  °[facility] °[phone] °  °Location: °[address] °Clinic Hours: °Mon-Thu 8a-5p °Services: °Most basic dental services including extractions and fillings. °Payment Options: °PAYMENT IS DUE AT THE TIME OF SERVICE. °Sliding scale, up to 50% off - bring proof if income or support. °Medicaid with dental option accepted. °Best way to get seen: °Call to schedule an appointment, can usually be seen within 2 weeks OR they will try to see walk-ins - show up at 8a or 2p (you may have to wait). °  °  °[facility] °919-245-2435 °[place] RESIDENTS ONLY °  °Location: °[facility], [address], [place], [place] [address] °Clinic Hours: By appointment only. °Monday - Thursday 8am-5pm, Friday 8am-12pm °Services: Cleanings, fillings, extractions. °Payment Options: °PAYMENT IS DUE AT THE TIME OF SERVICE. °Cash, Visa or MasterCard. Sliding scale - $30 minimum per service. °Best way to get seen: °Come in to office, complete packet and make an appointment - need proof of income °or support monies for each household member and proof of [place] residence. °Usually takes about a month to get in. °  °  °[facility] °[phone] °  °Location: °[address]   [place] °Clinic Hours: Walk-in Urgent Care Dental Services are offered Monday-Friday  mornings only. °The numbers of emergencies accepted daily is limited to the number of °providers available. °Maximum 15 - Mondays, Wednesdays & Thursdays °Maximum 10 - Tuesdays & Fridays °Services: °You do not need to be a [place] resident to be seen for a dental emergency. °Emergencies are defined as pain, swelling, abnormal bleeding, or dental trauma. Walkins will receive x-rays if needed. °NOTE: Dental cleaning is not an emergency. °Payment Options: °PAYMENT IS DUE AT THE TIME OF SERVICE. °Minimum co-pay is $40.00 for uninsured patients. °Minimum co-pay is $3.00 for Medicaid with dental coverage. °Dental Insurance is accepted and must be presented at time of visit. °Medicare does not cover dental. °Forms of payment: Cash, credit card, checks. °Best way to get seen: °If not previously registered with the clinic, walk-in dental registration begins at 7:15 am and is on a first come/first serve basis. °If previously registered with the clinic, call to make an appointment. °  °  °[facility] °919-776-4359 °[place] RESIDENTS ONLY °  °Location: °[address], [place], [place] °Clinic Hours: °Mon-Thu 10a-2p °Services: Extractions only! °Payment Options: °FREE (donations accepted) - bring proof of income or support °Best way to get seen: °Call and schedule an appointment OR come at 8am on the 1st Monday of every month (except for holidays) when it is first come/first served. °  °  °[facility] °[phone] °  °Location: °[address], [place] °Clinic Hours: °Friday mornings °Services, Payment Options, Best way to get seen: °Call for info °

## 2017-03-19 NOTE — ED Provider Notes (Addendum)
[facility] Emergency Department Provider Note  Time seen: 8:49 PM  I have reviewed the triage vital signs and the nursing notes.   HISTORY  Chief Complaint Headache and Dental Pain    HPI VERITA KURODA is a 46 y.o. female with a past medical history of migraines presents to the emergency department for multiple complaints.  According to the patient for the past months to years she has been experiencing left lower molar pain.  She states the pain will worsen at times.  States she now has a headache 8/10 in severity that feels like a typical migraine headache per patient.  She also states over the past 2 weeks she has passed out twice, the last of which occurred approximately 1 week ago.  Denies any history of syncope prior to that.  Denies any chest pain or shortness of breath.  Denies any vaginal bleeding black or bloody stool.  Past Medical History:  Diagnosis Date  . Migraines   . Pneumonia   . Pneumothorax     There are no active problems to display for this patient.   Past Surgical History:  Procedure Laterality Date  . ABDOMINAL HYSTERECTOMY    . LUNG SURGERY      Prior to Admission medications   Medication Sig Start Date End Date Taking? Authorizing Provider  dicyclomine (BENTYL) 20 MG tablet Take 1 tablet (20 mg total) by mouth 3 (three) times daily as needed for spasms. 09/30/16 09/30/17  Joni Reining, PA-C  ondansetron (ZOFRAN) 8 MG tablet Take 1 tablet (8 mg total) by mouth every 8 (eight) hours as needed for nausea or vomiting. 09/30/16   Joni Reining, PA-C  traMADol (ULTRAM) 50 MG tablet Take 1 tablet (50 mg total) by mouth every 6 (six) hours as needed. 10/13/15   Triplett, Rulon Eisenmenger B, FNP    No Known Allergies  No family history on file.  Social History Social History  Substance Use Topics  . Smoking status: Current Every Day Smoker    Packs/day: 0.50    Types: Cigarettes  . Smokeless tobacco: Never Used  . Alcohol use No     Review of Systems Constitutional: Negative for fever.  Positive for syncope x2 over the past 2 weeks. Cardiovascular: Negative for chest pain. Respiratory: Negative for shortness of breath. Gastrointestinal: Negative for abdominal pain, vomiting and diarrhea.  Negative for black or bloody stool  Genitourinary: Negative for dysuria.  Negative for vaginal bleeding Musculoskeletal: Negative for back pain. Skin: Negative for rash. Neurological: Negative for headache All other ROS negative  ____________________________________________   PHYSICAL EXAM:  VITAL SIGNS: ED Triage Vitals  Enc Vitals Group     BP 03/19/17 2012 (!) 143/94     Pulse Rate 03/19/17 2012 93     Resp 03/19/17 2012 17     Temp 03/19/17 2012 98.4 F (36.9 C)     Temp Source 03/19/17 2012 Oral     SpO2 03/19/17 2012 97 %     Weight 03/19/17 2012 110 lb (49.9 kg)     Height 03/19/17 2012 5\' 2"  (1.575 m)     Head Circumference --      Peak Flow --      Pain Score 03/19/17 2011 8     Pain Loc --      Pain Edu? --      Excl. in GC? --     Constitutional: Alert and oriented. Well appearing and in no distress. Eyes: Normal exam  ENT   Head: Normocephalic and atraumatic.   Mouth/Throat: Mucous membranes are moist.  Overall poor dentition.  In the area of concern there is no abscess identified but the patient does have dental caries. Cardiovascular: Normal rate, regular rhythm. No murmur Respiratory: Normal respiratory effort without tachypnea nor retractions. Breath sounds are clear  Gastrointestinal: Soft and nontender. No distention.  Musculoskeletal: Nontender with normal range of motion in all extremities.  Neurologic:  Normal speech and language. No gross focal neurologic deficits  Skin:  Skin is warm, dry and intact.  Psychiatric: Mood and affect are normal.   ____________________________________________    EKG  EKG reviewed and interpreted by myself shows normal sinus rhythm at 70 bpm,  narrow QRS, normal axis, normal intervals, no concerning ST changes.  Normal EKG  ____________________________________________   INITIAL IMPRESSION / ASSESSMENT AND PLAN / ED COURSE  Pertinent labs & imaging results that were available during my care of the patient were reviewed by me and considered in my medical decision making (see chart for details).  Patient presents to the emergency department multiple complaints including syncope, dental pain and headache.  Differential would include syncope, migraine, tension headache, dental carry, dental pain.  Exam was consistent with dental pain/dental caries we will cover with penicillin.  Given the patient's migraine headache and recent syncopal episodes we will check labs, EKG, treat with IV fluids, Toradol, Reglan, Benadryl and continue to closely monitor.  Patient agreeable to this plan of care.  I have reviewed the patient's records she has been seen for migraines and dental infections in the past.  Medical history is largely noncontributory to today's ER visit.  EKG is normal.  Labs are largely within normal limits, troponin negative.  Patient is feeling better not for headache medication.  We will discharge with penicillin for dental carry.  She states she found out her daughter was involved in a car accident needs to leave the emergency department immediately.  Patient will be discharged with penicillin.  ____________________________________________   FINAL CLINICAL IMPRESSION(S) / ED DIAGNOSES  Dental pain Headache Syncope    Minna AntisPaduchowski, Gilbert Manolis, MD 03/19/17 2147    Minna AntisPaduchowski, Mckaela Howley, MD 03/19/17 2202

## 2017-03-19 NOTE — ED Triage Notes (Addendum)
Pt arrives to ED via POV with c/o headache and toothache x3 days. Pt reports previous h/x of migraines and reports current s/x's are similar. Pt also reports a "possible abscess" with pain and swelling to the upper left jaw. Pt reports (+) nausea, but denies abdominal pain, chest pain, or SHOB. Pt is A&O, in NAD: RR even, regular, and unlabored; skin color/temp is WNL.

## 2017-10-31 ENCOUNTER — Emergency Department
Admission: EM | Admit: 2017-10-31 | Discharge: 2017-10-31 | Disposition: A | Discharge status: Home / Self Care | Payer: Medicaid Other | Attending: Student in an Organized Health Care Education/Training Program | Admitting: Student in an Organized Health Care Education/Training Program

## 2017-10-31 ENCOUNTER — Emergency Department: Payer: Medicaid Other

## 2017-10-31 ENCOUNTER — Encounter: Payer: Self-pay | Admitting: Emergency Medicine

## 2017-10-31 ENCOUNTER — Other Ambulatory Visit: Payer: Self-pay

## 2017-10-31 DIAGNOSIS — F17210 Nicotine dependence, cigarettes, uncomplicated: Secondary | ICD-10-CM | POA: Insufficient documentation

## 2017-10-31 DIAGNOSIS — M542 Cervicalgia: Secondary | ICD-10-CM | POA: Diagnosis not present

## 2017-10-31 MED ORDER — DICLOFENAC SODIUM 50 MG PO TBEC: 50.0000 mg | DELAYED_RELEASE_TABLET | Freq: Two times a day (BID) | ORAL | 1 refills | Status: AC

## 2017-10-31 MED ORDER — CYCLOBENZAPRINE HCL 10 MG PO TABS
10.0000 mg | ORAL_TABLET | Freq: Once | ORAL | Status: AC
Start: 2017-10-31 — End: 2017-10-31
  Administered 2017-10-31: 10 mg via ORAL
  Filled 2017-10-31: qty 1

## 2017-10-31 MED ORDER — CYCLOBENZAPRINE HCL 5 MG PO TABS: 5.0000 mg | ORAL_TABLET | Freq: Three times a day (TID) | ORAL | 0 refills | Status: DC | PRN

## 2017-10-31 NOTE — Discharge Instructions (Signed)
Your exam and x-ray are consistent with a muscle strain and tension headache. Your symptoms may be caused by poor positioning at work. Follow-up with [facility] for ongoing management. Apply ice and/or moist heat to the muscles to reduce symptoms. Take the prescription meds as directed.

## 2017-10-31 NOTE — ED Triage Notes (Signed)
Pt to ED via POV c/o neck pain x 1 month, pain has been worse over the past week. Pt is in NAD at this time.

## 2017-10-31 NOTE — ED Notes (Signed)
Pt also complains of fever for two days, left lower quadrant pain and left flank pain. Pt states she thinks she has a urinary tract infection and has been taking over the counter "urinary pills".

## 2017-11-02 NOTE — ED Provider Notes (Signed)
[facility] Emergency Department Provider Note ____________________________________________  Time seen: 2045  I have reviewed the triage vital signs and the nursing notes.  HISTORY  Chief Complaint  Neck Injury  HPI Lindsay Friedman is a 47 y.o. female presents herself to the ED for evaluation of chronic intermittent neck pain for the last month.  Patient describes significant start the increase over the last week.  She denies any injury, accident, or trauma.  She localizes pain to the posterior portion of the neck over the spinous processes of the base of the neck.  She describes subjective grinding and crunching when she ranges her neck.  She denies any distal paresthesias, wrist drop, or strength changes.  She has been taking BC powders with limited benefit.  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  She describes her head is constantly looking down and she has to [employer] on pallets.  She denies having to do any overhead work or any heavy lifting.  Past Medical History:  Diagnosis Date  . Migraines   . Pneumonia   . Pneumothorax     There are no active problems to display for this patient.   Past Surgical History:  Procedure Laterality Date  . ABDOMINAL HYSTERECTOMY    . LUNG SURGERY      Prior to Admission medications   Medication Sig Start Date End Date Taking? Authorizing Provider  cyclobenzaprine (FLEXERIL) 5 MG tablet Take 1 tablet (5 mg total) by mouth 3 (three) times daily as needed for muscle spasms. 10/31/17   Chelsee Hosie, Charlesetta Ivory, PA-C  diclofenac (VOLTAREN) 50 MG EC tablet Take 1 tablet (50 mg total) by mouth 2 (two) times daily. 10/31/17 11/30/17  Yetunde Leis, Charlesetta Ivory, PA-C  dicyclomine (BENTYL) 20 MG tablet Take 1 tablet (20 mg total) by mouth 3 (three) times daily as needed for spasms. 09/30/16 09/30/17  Joni Reining, PA-C  ondansetron (ZOFRAN) 8 MG tablet Take 1 tablet (8 mg total) by mouth every 8  (eight) hours as needed for nausea or vomiting. 09/30/16   Joni Reining, PA-C  penicillin v potassium (VEETID) 500 MG tablet Take 1 tablet (500 mg total) by mouth 4 (four) times daily. 03/19/17   Minna Antis, MD  traMADol (ULTRAM) 50 MG tablet Take 1 tablet (50 mg total) by mouth every 6 (six) hours as needed. 10/13/15   Chinita Pester, FNP    Allergies Patient has no known allergies.  No family history on file.  Social History Social History   Tobacco Use  . Smoking status: Current Every Day Smoker    Packs/day: 0.50    Types: Cigarettes  . Smokeless tobacco: Never Used  Substance Use Topics  . Alcohol use: No  . Drug use: Yes    Types: Marijuana    Review of Systems  Constitutional: Negative for fever. Eyes: Negative for visual changes. ENT: Negative for sore throat. Cardiovascular: Negative for chest pain. Respiratory: Negative for shortness of breath. Gastrointestinal: Negative for abdominal pain, vomiting and diarrhea. Genitourinary: Negative for dysuria. Musculoskeletal: Negative for back pain. Reports neck pain as above Skin: Negative for rash. Neurological: Negative for headaches, focal weakness or numbness. ____________________________________________  PHYSICAL EXAM:  VITAL SIGNS: ED Triage Vitals  Enc Vitals Group     BP 10/31/17 1829 113/72     Pulse Rate 10/31/17 1829 98     Resp 10/31/17 1829 14     Temp 10/31/17 1829 99.1 F (37.3  C)     Temp Source 10/31/17 1829 Oral     SpO2 10/31/17 1829 97 %     Weight 10/31/17 1830 110 lb (49.9 kg)     Height 10/31/17 1830 5\' 2"  (1.575 m)     Head Circumference --      Peak Flow --      Pain Score 10/31/17 1832 8     Pain Loc --      Pain Edu? --      Excl. in GC? --     Constitutional: Alert and oriented. Well appearing and in no distress. Head: Normocephalic and atraumatic. Eyes: Conjunctivae are normal. Normal extraocular movements Neck: Supple. No thyromegaly.  Normal range of motion  without crepitus.  Patient localized tenderness to the posterior spinous processes at C7 and T1. Hematological/Lymphatic/Immunological: No cervical lymphadenopathy. Cardiovascular: Normal rate, regular rhythm. Normal distal pulses.  No cyanosis, clubbing, or edema of the upper extremities Respiratory: Normal respiratory effort. No wheezes/rales/rhonchi. Musculoskeletal: Normal spinal alignment without midline tenderness, spasm, deformity, step-off.  Nontender with normal range of motion in all extremities.  Neurologic: Cranial nerves II through XII grossly intact.  Normal you ED test bilaterally.  Normal intrinsic and opposition testing.  Normal gait without ataxia. Normal speech and language. No gross focal neurologic deficits are appreciated. Skin:  Skin is warm, dry and intact. No rash noted. ____________________________________________   RADIOLOGY  Cervical Spine  Decreased lordosis. No acute findings. ____________________________________________  PROCEDURES  Procedures Flexeril 10 mg PO ____________________________________________  INITIAL IMPRESSION / ASSESSMENT AND PLAN / ED COURSE  She with ED evaluation of a 1 month complaint of intermittent persistent neck pain exam is overall benign.  No acute neuromuscular deficit is appreciated.  Her x-ray is also reassuring at this time as it shows no acute findings.  Patient does have some decreased lordosis consistent with muscle spasm.  She will be discharged with a prescription for Flexeril and diclofenac to dose as directed.  She is referred to [facility] for ongoing management of her persistent symptoms.  She is also advised to apply ice and/or moist heat to the neck for comfort.  Precautions have been reviewed.  Work note is provided as requested. ____________________________________________  FINAL CLINICAL IMPRESSION(S) / ED DIAGNOSES  Final diagnoses:  Neck pain, musculoskeletal      Hedwig Mcfall, Charlesetta IvoryJenise V Bacon, PA-C 11/02/17  1641    Willy Eddyobinson, Patrick, MD 11/05/17 1400

## 2018-01-27 ENCOUNTER — Emergency Department (HOSPITAL_COMMUNITY)
Admission: EM | Admit: 2018-01-27 | Discharge: 2018-01-27 | Disposition: A | Discharge status: Home / Self Care | Payer: Medicaid Other | Attending: Emergency Medicine | Admitting: Emergency Medicine

## 2018-01-27 ENCOUNTER — Emergency Department (HOSPITAL_COMMUNITY): Payer: Medicaid Other

## 2018-01-27 ENCOUNTER — Other Ambulatory Visit: Payer: Self-pay

## 2018-01-27 DIAGNOSIS — R0789 Other chest pain: Secondary | ICD-10-CM | POA: Insufficient documentation

## 2018-01-27 DIAGNOSIS — M549 Dorsalgia, unspecified: Secondary | ICD-10-CM | POA: Insufficient documentation

## 2018-01-27 DIAGNOSIS — M62838 Other muscle spasm: Secondary | ICD-10-CM | POA: Insufficient documentation

## 2018-01-27 DIAGNOSIS — Z79899 Other long term (current) drug therapy: Secondary | ICD-10-CM | POA: Insufficient documentation

## 2018-01-27 DIAGNOSIS — F17210 Nicotine dependence, cigarettes, uncomplicated: Secondary | ICD-10-CM | POA: Insufficient documentation

## 2018-01-27 DIAGNOSIS — R05 Cough: Secondary | ICD-10-CM | POA: Insufficient documentation

## 2018-01-27 LAB — BASIC METABOLIC PANEL
Anion gap: 11 (ref 5–15)
BUN: 6 mg/dL (ref 6–20)
CO2: 25 mmol/L (ref 22–32)
Calcium: 9.5 mg/dL (ref 8.9–10.3)
Chloride: 107 mmol/L (ref 98–111)
Creatinine, Ser: 0.69 mg/dL (ref 0.44–1.00)
GFR calc Af Amer: >60 mL/min (ref >60)
GFR calc non Af Amer: >60 mL/min (ref >60)
Glucose, Bld: 105 mg/dL — ABNORMAL HIGH (ref 70–99)
Potassium: 4.3 mmol/L (ref 3.5–5.1)
Sodium: 143 mmol/L (ref 135–145)

## 2018-01-27 LAB — CBC
HCT: 35.9 % — ABNORMAL LOW (ref 36.0–46.0)
Hemoglobin: 11.5 g/dL — ABNORMAL LOW (ref 12.0–15.0)
MCH: 32.3 pg (ref 26.0–34.0)
MCHC: 32 g/dL (ref 30.0–36.0)
MCV: 100.8 fL — ABNORMAL HIGH (ref 78.0–100.0)
Platelets: 272 10*3/uL (ref 150–400)
RBC: 3.56 MIL/uL — ABNORMAL LOW (ref 3.87–5.11)
RDW: 13.5 % (ref 11.5–15.5)
WBC: 6.8 10*3/uL (ref 4.0–10.5)

## 2018-01-27 LAB — I-STAT BETA HCG BLOOD, ED (MC, WL, AP ONLY): I-stat hCG, quantitative: <5 m[IU]/mL (ref <5)

## 2018-01-27 LAB — I-STAT TROPONIN, ED: Troponin i, poc: 0.02 ng/mL (ref 0.00–0.08)

## 2018-01-27 NOTE — ED Triage Notes (Signed)
Pt to ED via POV with complaints of posterior neck pain that radiates down her spine to her scapula and anteriorly to her chest/rib cage area. Pt describes the pain as a 9/10 and started in her neck 2 weeks ago that has progressively gotten worse. Pt states that she is also having Nausea.

## 2018-01-27 NOTE — ED Provider Notes (Signed)
[facility]-EMERGENCY DEPT Provider Note   CSN: [id number] Arrival date & time: 01/27/18  1536     History   Chief Complaint Chief Complaint  Patient presents with  . Back Pain  . Chest Pain    Rib cage    HPI Lindsay Friedman is a 47 y.o. female.  47 yo F with a chief complaint of right-sided chest pain.  Is been going on for the past couple weeks.  She does not identify anything that makes it better or worse.  Not exertional.  No shortness of breath with this.  She feels generally run down.  Is been trying to take care of her grandchild and feels that that is a difficult thing for her.  She denies fevers or chills.  Has had a mild cough denies sputum or change in sputum.  She denies hemoptysis.  Denies history of PE or DVT.  Denies recent surgery denies prolonged immobilization.  She denies prolonged travel.  Denies estrogen use.  She denies history of MI.  She is a current everyday smoker.  Denies family history of MI.  She has no known diagnosis of hypertension hyperlipidemia or diabetes though she has not seen a doctor in over 20 years.  The history is provided by the patient.  Back Pain   Associated symptoms include chest pain. Pertinent negatives include no fever, no headaches and no dysuria.  Chest Pain   Associated symptoms include back pain. Pertinent negatives include no dizziness, no fever, no headaches, no nausea, no palpitations, no shortness of breath and no vomiting.  Illness  This is a new problem. The current episode started 2 days ago. The problem occurs constantly. The problem has not changed since onset.Associated symptoms include chest pain. Pertinent negatives include no headaches and no shortness of breath. Nothing aggravates the symptoms. Nothing relieves the symptoms. She has tried nothing for the symptoms. The treatment provided no relief.    Past Medical History:  Diagnosis Date  . Migraines   . Pneumonia   . Pneumothorax     There  are no active problems to display for this patient.   Past Surgical History:  Procedure Laterality Date  . ABDOMINAL HYSTERECTOMY    . LUNG SURGERY       OB History   None      Home Medications    Prior to Admission medications   Medication Sig Start Date End Date Taking? Authorizing Provider  acetaminophen (TYLENOL) 500 MG tablet Take 3,000 mg by mouth daily as needed for moderate pain.   Yes [provider]  Aspirin-Caffeine (BC FAST PAIN RELIEF PO) Take 4 packets by mouth daily as needed (pain).   Yes [provider]  HYDROcodone-acetaminophen (NORCO/VICODIN) 5-325 MG tablet Take 1 tablet by mouth daily as needed for moderate pain.   Yes [provider]  RETIN-A 0.05 % cream Apply 1 application topically once a week. 11/16/17  Yes [provider]  cyclobenzaprine (FLEXERIL) 5 MG tablet Take 1 tablet (5 mg total) by mouth 3 (three) times daily as needed for muscle spasms. Patient not taking: Reported on 01/27/2018 10/31/17   Menshew, Charlesetta Ivory, PA-C  dicyclomine (BENTYL) 20 MG tablet Take 1 tablet (20 mg total) by mouth 3 (three) times daily as needed for spasms. Patient not taking: Reported on 01/27/2018 09/30/16 09/30/17  Joni Reining, PA-C  ondansetron (ZOFRAN) 8 MG tablet Take 1 tablet (8 mg total) by mouth every 8 (eight) hours as needed  for nausea or vomiting. Patient not taking: Reported on 01/27/2018 09/30/16   Joni Reining, PA-C  penicillin v potassium (VEETID) 500 MG tablet Take 1 tablet (500 mg total) by mouth 4 (four) times daily. Patient not taking: Reported on 01/27/2018 03/19/17   Minna Antis, MD  traMADol (ULTRAM) 50 MG tablet Take 1 tablet (50 mg total) by mouth every 6 (six) hours as needed. Patient not taking: Reported on 01/27/2018 10/13/15   Chinita Pester, FNP    Family History No family history on file.  Social History Social History   Tobacco Use  . Smoking status: Current Every Day Smoker    Packs/day:  0.50    Types: Cigarettes  . Smokeless tobacco: Never Used  Substance Use Topics  . Alcohol use: No  . Drug use: Yes    Types: Marijuana     Allergies   Bee venom   Review of Systems Review of Systems  Constitutional: Negative for chills and fever.  HENT: Negative for congestion and rhinorrhea.   Eyes: Negative for redness and visual disturbance.  Respiratory: Negative for shortness of breath and wheezing.   Cardiovascular: Positive for chest pain. Negative for palpitations.  Gastrointestinal: Negative for nausea and vomiting.  Genitourinary: Negative for dysuria and urgency.  Musculoskeletal: Positive for back pain. Negative for arthralgias and myalgias.  Skin: Negative for pallor and wound.  Neurological: Negative for dizziness and headaches.     Physical Exam Updated Vital Signs BP 136/77 (BP Location: Left Arm)   Pulse 88   Temp 98.8 F (37.1 C) (Oral)   Resp 15   Ht 5\' 2"  (1.575 m)   Wt 49.9 kg   SpO2 98%   BMI 20.12 kg/m   Physical Exam  Constitutional: She is oriented to person, place, and time. She appears well-developed and well-nourished. No distress.  Appears much older than stated age.  HENT:  Head: Normocephalic and atraumatic.  Eyes: Pupils are equal, round, and reactive to light. EOM are normal.  Neck: Normal range of motion. Neck supple.  Cardiovascular: Normal rate and regular rhythm. Exam reveals no gallop and no friction rub.  No murmur heard. Pulmonary/Chest: Effort normal. She has no wheezes. She has no rales.  Abdominal: Soft. She exhibits no distension. There is no tenderness.  Musculoskeletal: She exhibits tenderness. She exhibits no edema.  Tender to palpation worst about the right trapezius muscle belly.  Some tenderness to the right anterior chest wall.  This does reproduce her pain.  No midline spinal tenderness.  Neurological: She is alert and oriented to person, place, and time.  Skin: Skin is warm and dry. She is not diaphoretic.    Psychiatric: She has a normal mood and affect. Her behavior is normal.  Nursing note and vitals reviewed.    ED Treatments / Results  Labs (all labs ordered are listed, but only abnormal results are displayed) Labs Reviewed  BASIC METABOLIC PANEL - Abnormal; Notable for the following components:      Result Value   Glucose, Bld 105 (*)    All other components within normal limits  CBC - Abnormal; Notable for the following components:   RBC 3.56 (*)    Hemoglobin 11.5 (*)    HCT 35.9 (*)    MCV 100.8 (*)    All other components within normal limits  I-STAT TROPONIN, ED  I-STAT BETA HCG BLOOD, ED ([facility], [facility], [facility] ONLY)    EKG EKG Interpretation  Date/Time:  Tuesday January 27 2018 15:43:58  EDT Ventricular Rate:  96 PR Interval:    QRS Duration: 79 QT Interval:  342 QTC Calculation: 433 R Axis:   85 Text Interpretation:  Sinus rhythm Baseline wander in lead(s) V1 t wave amplitude decreased diffusely Otherwise no significant change Confirmed by Melene Plan [phone]) on 01/27/2018 4:04:37 PM   Radiology Dg Chest 2 View  Result Date: 01/27/2018 CLINICAL DATA:  Midline chest pain for 2 weeks. EXAM: CHEST - 2 VIEW COMPARISON:  02/23/2015 FINDINGS: The cardiac silhouette, mediastinal and hilar contours are within normal limits and stable. Lungs demonstrate hyperinflation and suspected emphysematous changes but no infiltrates, edema or effusions. The bony thorax is intact. IMPRESSION: Chronic lung changes.  No acute pulmonary findings. Electronically Signed   By: Rudie Meyer M.D.   On: 01/27/2018 17:33    Procedures Procedures (including critical care time) Discussed smoking cessation with patient and was they were offerred resources to help stop.  Total time was 5 min CPT code 25956.   Medications Ordered in ED Medications - No data to display   Initial Impression / Assessment and Plan / ED Course  I have reviewed the triage vital signs and the nursing notes.  Pertinent labs  & imaging results that were available during my care of the patient were reviewed by me and considered in my medical decision making (see chart for details).     47 yo F with a chief complaints of right-sided chest wall and back pain.  Most likely musculoskeletal is is reproduced on palpation and with twisting.  She has had a history of blebs with spontaneous pneumothorax, will obtain chest x-ray labs were ordered by triage.  EKG with no significant change.  CXR negative as viewed by me.  D/c home.   6:20 PM:  I have discussed the diagnosis/risks/treatment options with the patient and believe the pt to be eligible for discharge home to follow-up with PCP. We also discussed returning to the ED immediately if new or worsening sx occur. We discussed the sx which are most concerning (e.g., sudden worsening pain, fever, inability to tolerate by mouth) that necessitate immediate return. Medications administered to the patient during their visit and any new prescriptions provided to the patient are listed below.  Medications given during this visit Medications - No data to display    The patient appears reasonably screen and/or stabilized for discharge and I doubt any other medical condition or other [facility] requiring further screening, evaluation, or treatment in the ED at this time prior to discharge.    Final Clinical Impressions(s) / ED Diagnoses   Final diagnoses:  Chest wall pain  Trapezius muscle spasm    ED Discharge Orders    None       Melene Plan, DO 01/27/18 1820

## 2018-01-27 NOTE — Discharge Instructions (Signed)
Take 4 over the counter ibuprofen tablets 3 times a day or 2 over-the-counter naproxen tablets twice a day for pain. Also take tylenol 1000mg(2 extra strength) four times a day.    

## 2018-03-14 ENCOUNTER — Emergency Department (HOSPITAL_COMMUNITY)
Admission: EM | Admit: 2018-03-14 | Discharge: 2018-03-14 | Disposition: A | Discharge status: Home / Self Care | Payer: Medicaid Other | Attending: Emergency Medicine | Admitting: Emergency Medicine

## 2018-03-14 ENCOUNTER — Encounter (HOSPITAL_COMMUNITY): Payer: Self-pay

## 2018-03-14 ENCOUNTER — Other Ambulatory Visit: Payer: Self-pay

## 2018-03-14 DIAGNOSIS — F17210 Nicotine dependence, cigarettes, uncomplicated: Secondary | ICD-10-CM | POA: Insufficient documentation

## 2018-03-14 DIAGNOSIS — R21 Rash and other nonspecific skin eruption: Secondary | ICD-10-CM | POA: Insufficient documentation

## 2018-03-14 DIAGNOSIS — J029 Acute pharyngitis, unspecified: Secondary | ICD-10-CM | POA: Insufficient documentation

## 2018-03-14 DIAGNOSIS — H6692 Otitis media, unspecified, left ear: Secondary | ICD-10-CM | POA: Insufficient documentation

## 2018-03-14 MED ORDER — AMOXICILLIN 500 MG PO CAPS: 1000.0000 mg | ORAL_CAPSULE | Freq: Two times a day (BID) | ORAL | 0 refills | Status: DC

## 2018-03-14 NOTE — ED Triage Notes (Signed)
She c/o occasional, self-limiting fevers and generalized rash "for months". She is here today also c/o recent n/v. She is in no distress.

## 2018-03-14 NOTE — ED Provider Notes (Signed)
[facility]-EMERGENCY DEPT Provider Note   CSN: [id number] Arrival date & time: 03/14/18  1236     History   Chief Complaint Chief Complaint  Patient presents with  . Rash  . Fever    HPI Lindsay Friedman is a 47 y.o. female.  HPI   47 year old female with several complaints.  Primarily she is complaining of a rash, sore throat and ear pain.  Symptom onset several days ago.  Pain in bilateral ears, right than left.  No drainage.  She reports a past history of recurrent ear infections.  No cough.  No shortness of breath.  She is also had a rash for several months.  She has not tried taking anything for it.  It itches at times.  Is not acutely worse with her more recent symptoms.  Past Medical History:  Diagnosis Date  . Migraines   . Pneumonia   . Pneumothorax     There are no active problems to display for this patient.   Past Surgical History:  Procedure Laterality Date  . ABDOMINAL HYSTERECTOMY    . LUNG SURGERY       OB History   None      Home Medications    Prior to Admission medications   Medication Sig Start Date End Date Taking? Authorizing Provider  acetaminophen (TYLENOL) 500 MG tablet Take 3,000 mg by mouth daily as needed for moderate pain.    [provider]  amoxicillin (AMOXIL) 500 MG capsule Take 2 capsules (1,000 mg total) by mouth 2 (two) times daily. 03/14/18   Raeford Razor, MD  Aspirin-Caffeine (BC FAST PAIN RELIEF PO) Take 4 packets by mouth daily as needed (pain).    [provider]  cyclobenzaprine (FLEXERIL) 5 MG tablet Take 1 tablet (5 mg total) by mouth 3 (three) times daily as needed for muscle spasms. Patient not taking: Reported on 01/27/2018 10/31/17   Menshew, Charlesetta Ivory, PA-C  dicyclomine (BENTYL) 20 MG tablet Take 1 tablet (20 mg total) by mouth 3 (three) times daily as needed for spasms. Patient not taking: Reported on 01/27/2018 09/30/16 09/30/17  Joni Reining, PA-C    HYDROcodone-acetaminophen (NORCO/VICODIN) 5-325 MG tablet Take 1 tablet by mouth daily as needed for moderate pain.    [provider]  ondansetron (ZOFRAN) 8 MG tablet Take 1 tablet (8 mg total) by mouth every 8 (eight) hours as needed for nausea or vomiting. Patient not taking: Reported on 01/27/2018 09/30/16   Joni Reining, PA-C  penicillin v potassium (VEETID) 500 MG tablet Take 1 tablet (500 mg total) by mouth 4 (four) times daily. Patient not taking: Reported on 01/27/2018 03/19/17   Minna Antis, MD  RETIN-A 0.05 % cream Apply 1 application topically once a week. 11/16/17   [provider]  traMADol (ULTRAM) 50 MG tablet Take 1 tablet (50 mg total) by mouth every 6 (six) hours as needed. Patient not taking: Reported on 01/27/2018 10/13/15   Chinita Pester, FNP    Family History No family history on file.  Social History Social History   Tobacco Use  . Smoking status: Current Every Day Smoker    Packs/day: 0.50    Types: Cigarettes  . Smokeless tobacco: Never Used  Substance Use Topics  . Alcohol use: No  . Drug use: Yes    Types: Marijuana     Allergies   Bee venom   Review of Systems Review of Systems  All systems reviewed and negative,  other than as noted in HPI.  Physical Exam Updated Vital Signs BP 125/79   Pulse (!) 108   Temp 98.8 F (37.1 C) (Oral)   Resp 16   SpO2 96%   Physical Exam  Constitutional: She appears well-developed and well-nourished. No distress.  HENT:  Head: Normocephalic and atraumatic.  Right Ear: External ear normal.  Left Ear: External ear normal.  Left tympanic membrane is erythematous.  Opaque.  Loss of bony landmarks.  No effusion.  No bulging.  No perforation.  Left external auditory canal is occluded by wax.  Eyes: Conjunctivae are normal. Right eye exhibits no discharge. Left eye exhibits no discharge.  Neck: Neck supple.  Cardiovascular: Normal rate, regular rhythm and normal heart sounds. Exam  reveals no gallop and no friction rub.  No murmur heard. Pulmonary/Chest: Effort normal and breath sounds normal. No respiratory distress.  Abdominal: Soft. She exhibits no distension. There is no tenderness.  Musculoskeletal: She exhibits no edema or tenderness.  Neurological: She is alert.  Skin: Skin is warm and dry. Rash noted.  Scattered maculopapular rash on trunk.  Erythematous.  Psychiatric: She has a normal mood and affect. Her behavior is normal. Thought content normal.  Nursing note and vitals reviewed.    ED Treatments / Results  Labs (all labs ordered are listed, but only abnormal results are displayed) Labs Reviewed - No data to display  EKG None  Radiology No results found.  Procedures Procedures (including critical care time)  Medications Ordered in ED Medications - No data to display   Initial Impression / Assessment and Plan / ED Course  I have reviewed the triage vital signs and the nursing notes.  Pertinent labs & imaging results that were available during my care of the patient were reviewed by me and considered in my medical decision making (see chart for details).     47 year old female with otitis media.  Will place on amoxicillin.  Rash has been going on for several months at this point in likely unrelated to presenting complaints.  Final Clinical Impressions(s) / ED Diagnoses   Final diagnoses:  Left otitis media, unspecified otitis media type    ED Discharge Orders         Ordered    amoxicillin (AMOXIL) 500 MG capsule  2 times daily     03/14/18 1702           Raeford Razor, MD 03/14/18 1708

## 2018-03-14 NOTE — ED Notes (Signed)
ED Provider at bedside. EDP KOHUT 

## 2018-03-14 NOTE — ED Notes (Signed)
RX X 1 GIVEN 

## 2018-03-31 ENCOUNTER — Encounter: Payer: Self-pay | Admitting: Obstetrics and Gynecology

## 2018-06-01 ENCOUNTER — Emergency Department: Payer: Medicaid Other

## 2018-06-01 ENCOUNTER — Other Ambulatory Visit: Payer: Self-pay

## 2018-06-01 ENCOUNTER — Emergency Department
Admission: EM | Admit: 2018-06-01 | Discharge: 2018-06-01 | Disposition: A | Discharge status: Home / Self Care | Payer: Medicaid Other | Attending: Emergency Medicine | Admitting: Emergency Medicine

## 2018-06-01 DIAGNOSIS — F17210 Nicotine dependence, cigarettes, uncomplicated: Secondary | ICD-10-CM | POA: Insufficient documentation

## 2018-06-01 DIAGNOSIS — Z79899 Other long term (current) drug therapy: Secondary | ICD-10-CM | POA: Insufficient documentation

## 2018-06-01 DIAGNOSIS — R1010 Upper abdominal pain, unspecified: Secondary | ICD-10-CM | POA: Insufficient documentation

## 2018-06-01 LAB — COMPREHENSIVE METABOLIC PANEL
ALT: 13 U/L (ref 0–44)
AST: 20 U/L (ref 15–41)
Albumin: 4.4 g/dL (ref 3.5–5.0)
Alkaline Phosphatase: 67 U/L (ref 38–126)
Anion gap: 9 (ref 5–15)
BUN: 12 mg/dL (ref 6–20)
CO2: 24 mmol/L (ref 22–32)
Calcium: 9.1 mg/dL (ref 8.9–10.3)
Chloride: 105 mmol/L (ref 98–111)
Creatinine, Ser: 0.73 mg/dL (ref 0.44–1.00)
GFR calc Af Amer: >60 mL/min (ref >60)
GFR calc non Af Amer: >60 mL/min (ref >60)
Glucose, Bld: 94 mg/dL (ref 70–99)
Potassium: 3.6 mmol/L (ref 3.5–5.1)
Sodium: 138 mmol/L (ref 135–145)
Total Bilirubin: 0.6 mg/dL (ref 0.3–1.2)
Total Protein: 7.3 g/dL (ref 6.5–8.1)

## 2018-06-01 LAB — CBC
HCT: 39.8 % (ref 36.0–46.0)
Hemoglobin: 13.1 g/dL (ref 12.0–15.0)
MCH: 31.5 pg (ref 26.0–34.0)
MCHC: 32.9 g/dL (ref 30.0–36.0)
MCV: 95.7 fL (ref 80.0–100.0)
Platelets: 265 10*3/uL (ref 150–400)
RBC: 4.16 MIL/uL (ref 3.87–5.11)
RDW: 14.4 % (ref 11.5–15.5)
WBC: 8.8 10*3/uL (ref 4.0–10.5)
nRBC: 0 % (ref 0.0–0.2)

## 2018-06-01 LAB — URINALYSIS, COMPLETE (UACMP) WITH MICROSCOPIC
Bilirubin Urine: NEGATIVE
Glucose, UA: NEGATIVE mg/dL
Hgb urine dipstick: NEGATIVE
Ketones, ur: NEGATIVE mg/dL
Leukocytes, UA: NEGATIVE
Nitrite: NEGATIVE
Protein, ur: NEGATIVE mg/dL
Specific Gravity, Urine: 1.004 — ABNORMAL LOW (ref 1.005–1.030)
pH: 7 (ref 5.0–8.0)

## 2018-06-01 LAB — LIPASE, BLOOD: Lipase: 29 U/L (ref 11–51)

## 2018-06-01 MED ORDER — ONDANSETRON HCL 4 MG/2ML IJ SOLN
4.0000 mg | Freq: Once | INTRAMUSCULAR | Status: AC
  Administered 2018-06-01: 4 mg via INTRAVENOUS
  Filled 2018-06-01: qty 2

## 2018-06-01 MED ORDER — KETOROLAC TROMETHAMINE 30 MG/ML IJ SOLN
30.0000 mg | Freq: Once | INTRAMUSCULAR | Status: AC
  Administered 2018-06-01: 30 mg via INTRAVENOUS
  Filled 2018-06-01: qty 1

## 2018-06-01 MED ORDER — PANTOPRAZOLE SODIUM 20 MG PO TBEC: 20.0000 mg | DELAYED_RELEASE_TABLET | Freq: Every day | ORAL | 1 refills | Status: DC

## 2018-06-01 MED ORDER — SUCRALFATE 1 G PO TABS: 1.0000 g | ORAL_TABLET | Freq: Three times a day (TID) | ORAL | 1 refills | Status: DC

## 2018-06-01 NOTE — ED Provider Notes (Signed)
[facility] Emergency Department Provider Note   ____________________________________________    I have reviewed the triage vital signs and the nursing notes.   HISTORY  Chief Complaint Abdominal Pain     HPI Lindsay Friedman is a 48 y.o. female who presents with complaints of abdominal pain.  Patient describes right upper quadrant abdominal pain which started yesterday evening and is been constant, she describes it as moderate and cramping in nature.  She also describes pain in the back and some pain in the side.  Does have a history of kidney stones.  No history of abdominal surgery.  Denies fevers or chills.  Positive nausea.  Has not take anything for this.  No sick contacts.  She thought that it may be related to a urinary tract infection   Past Medical History:  Diagnosis Date  . Migraines   . Pneumonia   . Pneumothorax     There are no active problems to display for this patient.   Past Surgical History:  Procedure Laterality Date  . ABDOMINAL HYSTERECTOMY    . LUNG SURGERY      Prior to Admission medications   Medication Sig Start Date End Date Taking? Authorizing Provider  acetaminophen (TYLENOL) 500 MG tablet Take 3,000 mg by mouth daily as needed for moderate pain.    [provider]  amoxicillin (AMOXIL) 500 MG capsule Take 2 capsules (1,000 mg total) by mouth 2 (two) times daily. 03/14/18   Raeford RazorKohut, Stephen, MD  Aspirin-Caffeine (BC FAST PAIN RELIEF PO) Take 4 packets by mouth daily as needed (pain).    [provider]  cyclobenzaprine (FLEXERIL) 5 MG tablet Take 1 tablet (5 mg total) by mouth 3 (three) times daily as needed for muscle spasms. Patient not taking: Reported on 01/27/2018 10/31/17   Menshew, Charlesetta IvoryJenise V Bacon, PA-C  dicyclomine (BENTYL) 20 MG tablet Take 1 tablet (20 mg total) by mouth 3 (three) times daily as needed for spasms. Patient not taking: Reported on 01/27/2018 09/30/16 09/30/17  Joni ReiningSmith, Ronald K, PA-C   HYDROcodone-acetaminophen (NORCO/VICODIN) 5-325 MG tablet Take 1 tablet by mouth daily as needed for moderate pain.    [provider]  ondansetron (ZOFRAN) 8 MG tablet Take 1 tablet (8 mg total) by mouth every 8 (eight) hours as needed for nausea or vomiting. Patient not taking: Reported on 01/27/2018 09/30/16   Joni ReiningSmith, Ronald K, PA-C  pantoprazole (PROTONIX) 20 MG tablet Take 1 tablet (20 mg total) by mouth daily. 06/01/18 06/01/19  Jene EveryKinner, Aleksander Edmiston, MD  penicillin v potassium (VEETID) 500 MG tablet Take 1 tablet (500 mg total) by mouth 4 (four) times daily. Patient not taking: Reported on 01/27/2018 03/19/17   Minna AntisPaduchowski, Kevin, MD  RETIN-A 0.05 % cream Apply 1 application topically once a week. 11/16/17   [provider]  sucralfate (CARAFATE) 1 g tablet Take 1 tablet (1 g total) by mouth 4 (four) times daily -  with meals and at bedtime for 20 days. 06/01/18 06/21/18  Jene EveryKinner, Gussie Murton, MD  traMADol (ULTRAM) 50 MG tablet Take 1 tablet (50 mg total) by mouth every 6 (six) hours as needed. Patient not taking: Reported on 01/27/2018 10/13/15   Chinita Pesterriplett, Cari B, FNP     Allergies Bee venom  No family history on file.  Social History Social History   Tobacco Use  . Smoking status: Current Every Day Smoker    Packs/day: 0.50    Types: Cigarettes  . Smokeless tobacco: Never Used  Substance  Use Topics  . Alcohol use: No  . Drug use: Yes    Types: Marijuana    Review of Systems  Constitutional: No fever/chills Eyes: No visual changes.  ENT: No sore throat. Cardiovascular: Denies chest pain. Respiratory: Denies shortness of breath. Gastrointestinal: As above.   Genitourinary: Negative for dysuria. Musculoskeletal: Negative for back pain. Skin: Negative for rash. Neurological: Negative for headaches or weakness   ____________________________________________   PHYSICAL EXAM:  VITAL SIGNS: ED Triage Vitals [06/01/18 0808]  Enc Vitals Group     BP 132/77     Pulse  Rate 100     Resp 18     Temp 98 F (36.7 C)     Temp Source Oral     SpO2 97 %     Weight 49.9 kg (110 lb)     Height 1.575 m (5\' 2" )     Head Circumference      Peak Flow      Pain Score 8     Pain Loc      Pain Edu?      Excl. in GC?     Constitutional: Alert and oriented. Eyes: Conjunctivae are normal.   Nose: No congestion/rhinnorhea. Mouth/Throat: Mucous membranes are moist.   Neck:  Painless ROM Cardiovascular: Normal rate, regular rhythm. Grossly normal heart sounds.  Good peripheral circulation. Respiratory: Normal respiratory effort.  No retractions. Lungs CTAB. Gastrointestinal: Mild tenderness right upper quadrant, no distention, no significant CVA tenderness  Musculoskeletal:  Warm and well perfused Neurologic:  Normal speech and language. No gross focal neurologic deficits are appreciated.  Skin:  Skin is warm, dry and intact. No rash noted. Psychiatric: Mood and affect are normal. Speech and behavior are normal.  ____________________________________________   LABS (all labs ordered are listed, but only abnormal results are displayed)  Labs Reviewed  URINALYSIS, COMPLETE (UACMP) WITH MICROSCOPIC - Abnormal; Notable for the following components:      Result Value   Color, Urine STRAW (*)    APPearance CLEAR (*)    Specific Gravity, Urine 1.004 (*)    Bacteria, UA RARE (*)    All other components within normal limits  LIPASE, BLOOD  COMPREHENSIVE METABOLIC PANEL  CBC   ____________________________________________  EKG  None ____________________________________________  RADIOLOGY  Ultrasound negative for gallstones ____________________________________________   PROCEDURES  Procedure(s) performed: No  Procedures   Critical Care performed: No ____________________________________________   INITIAL IMPRESSION / ASSESSMENT AND PLAN / ED COURSE  Pertinent labs & imaging results that were available during my care of the patient were  reviewed by me and considered in my medical decision making (see chart for details).  Patient presents with right upper quadrant tenderness primarily, differential includes cholelithiasis, gastritis, pancreatitis.  Some pain in the back as well this may be radiation from gallbladder or possibly urinary pathology.  Sound negative for gallstones, will send for CT.  CT negative for kidney stones, possible thickening of the duodenitis, will treat with PPI, Carafate.  Appropriate for discharge at this time with outpatient follow-up with GI, strict return precautions discussed.  She is feeling significantly better.    ____________________________________________   FINAL CLINICAL IMPRESSION(S) / ED DIAGNOSES  Final diagnoses:  Upper abdominal pain        Note:  This document was prepared using Dragon voice recognition software and may include unintentional dictation errors.   Jene Every, MD 06/01/18 1407

## 2018-06-01 NOTE — ED Notes (Signed)
Pt states pain on left lower abd and lower back pain, states vomiting started last night induced by pain per pt. She states it's a lot like her previous kidney infections. Appears in NAD.

## 2018-06-01 NOTE — ED Triage Notes (Signed)
Pt c/o RUQ pain for the past 2 days with N/V.Lindsay Friedman denies diarrhea. States the pain radiates across to upper abd. Denies any hx of the same.

## 2018-10-12 ENCOUNTER — Emergency Department (HOSPITAL_COMMUNITY): Payer: Self-pay

## 2018-10-12 ENCOUNTER — Emergency Department (HOSPITAL_COMMUNITY)
Admission: EM | Admit: 2018-10-12 | Discharge: 2018-10-12 | Disposition: A | Discharge status: Home / Self Care | Payer: Self-pay | Attending: Emergency Medicine | Admitting: Emergency Medicine

## 2018-10-12 ENCOUNTER — Encounter (HOSPITAL_COMMUNITY): Payer: Self-pay | Admitting: Emergency Medicine

## 2018-10-12 ENCOUNTER — Other Ambulatory Visit: Payer: Self-pay

## 2018-10-12 DIAGNOSIS — Z79899 Other long term (current) drug therapy: Secondary | ICD-10-CM | POA: Insufficient documentation

## 2018-10-12 DIAGNOSIS — N201 Calculus of ureter: Secondary | ICD-10-CM | POA: Insufficient documentation

## 2018-10-12 DIAGNOSIS — F17210 Nicotine dependence, cigarettes, uncomplicated: Secondary | ICD-10-CM | POA: Insufficient documentation

## 2018-10-12 DIAGNOSIS — R079 Chest pain, unspecified: Secondary | ICD-10-CM | POA: Insufficient documentation

## 2018-10-12 DIAGNOSIS — R002 Palpitations: Secondary | ICD-10-CM | POA: Insufficient documentation

## 2018-10-12 LAB — URINALYSIS, COMPLETE (UACMP) WITH MICROSCOPIC
Bacteria, UA: NONE SEEN
Bilirubin Urine: NEGATIVE
Glucose, UA: NEGATIVE mg/dL
Hgb urine dipstick: NEGATIVE
Ketones, ur: 20 mg/dL — AB
Leukocytes,Ua: NEGATIVE
Nitrite: NEGATIVE
Protein, ur: NEGATIVE mg/dL
Specific Gravity, Urine: 1.015 (ref 1.005–1.030)
pH: 6 (ref 5.0–8.0)

## 2018-10-12 LAB — COMPREHENSIVE METABOLIC PANEL
ALT: 13 U/L (ref 0–44)
AST: 22 U/L (ref 15–41)
Albumin: 4.4 g/dL (ref 3.5–5.0)
Alkaline Phosphatase: 63 U/L (ref 38–126)
Anion gap: 9 (ref 5–15)
BUN: 7 mg/dL (ref 6–20)
CO2: 24 mmol/L (ref 22–32)
Calcium: 9.5 mg/dL (ref 8.9–10.3)
Chloride: 108 mmol/L (ref 98–111)
Creatinine, Ser: 0.67 mg/dL (ref 0.44–1.00)
GFR calc Af Amer: >60 mL/min (ref >60)
GFR calc non Af Amer: >60 mL/min (ref >60)
Glucose, Bld: 99 mg/dL (ref 70–99)
Potassium: 4.1 mmol/L (ref 3.5–5.1)
Sodium: 141 mmol/L (ref 135–145)
Total Bilirubin: 0.2 mg/dL — ABNORMAL LOW (ref 0.3–1.2)
Total Protein: 7.7 g/dL (ref 6.5–8.1)

## 2018-10-12 LAB — URINALYSIS, ROUTINE W REFLEX MICROSCOPIC
Bilirubin Urine: NEGATIVE
Glucose, UA: NEGATIVE mg/dL
Hgb urine dipstick: NEGATIVE
Ketones, ur: 20 mg/dL — AB
Leukocytes,Ua: NEGATIVE
Nitrite: NEGATIVE
Protein, ur: NEGATIVE mg/dL
Specific Gravity, Urine: 1.015 (ref 1.005–1.030)
pH: 6 (ref 5.0–8.0)

## 2018-10-12 LAB — CBC
HCT: 43.2 % (ref 36.0–46.0)
Hemoglobin: 14.1 g/dL (ref 12.0–15.0)
MCH: 34.1 pg — ABNORMAL HIGH (ref 26.0–34.0)
MCHC: 32.6 g/dL (ref 30.0–36.0)
MCV: 104.3 fL — ABNORMAL HIGH (ref 80.0–100.0)
Platelets: 268 10*3/uL (ref 150–400)
RBC: 4.14 MIL/uL (ref 3.87–5.11)
RDW: 12.6 % (ref 11.5–15.5)
WBC: 8.7 10*3/uL (ref 4.0–10.5)
nRBC: 0 % (ref 0.0–0.2)

## 2018-10-12 LAB — LIPASE, BLOOD: Lipase: 22 U/L (ref 11–51)

## 2018-10-12 LAB — TROPONIN I: Troponin I: <0.03 ng/mL (ref <0.03)

## 2018-10-12 MED ORDER — ONDANSETRON 4 MG PO TBDP: 4.0000 mg | ORAL_TABLET | Freq: Three times a day (TID) | ORAL | 0 refills | Status: DC | PRN

## 2018-10-12 MED ORDER — MORPHINE SULFATE (PF) 4 MG/ML IV SOLN
4.0000 mg | Freq: Once | INTRAVENOUS | Status: AC
  Administered 2018-10-12: 4 mg via INTRAVENOUS
  Filled 2018-10-12: qty 1

## 2018-10-12 MED ORDER — HYDROCODONE-ACETAMINOPHEN 5-325 MG PO TABS: 1.0000 | ORAL_TABLET | Freq: Four times a day (QID) | ORAL | 0 refills | Status: DC | PRN

## 2018-10-12 MED ORDER — ONDANSETRON HCL 4 MG/2ML IJ SOLN
4.0000 mg | Freq: Once | INTRAMUSCULAR | Status: AC
  Administered 2018-10-12: 4 mg via INTRAVENOUS
  Filled 2018-10-12: qty 2

## 2018-10-12 MED ORDER — SODIUM CHLORIDE 0.9 % IV BOLUS
500.0000 mL | Freq: Once | INTRAVENOUS | Status: AC
  Administered 2018-10-12: 500 mL via INTRAVENOUS

## 2018-10-12 MED ORDER — HYDROCODONE-ACETAMINOPHEN 5-325 MG PO TABS
1.0000 | ORAL_TABLET | Freq: Once | ORAL | Status: AC
  Administered 2018-10-12: 1 via ORAL
  Filled 2018-10-12: qty 1

## 2018-10-12 MED ORDER — TAMSULOSIN HCL 0.4 MG PO CAPS: 0.4000 mg | ORAL_CAPSULE | Freq: Every day | ORAL | 0 refills | Status: DC

## 2018-10-12 NOTE — Discharge Instructions (Addendum)

## 2018-10-12 NOTE — ED Triage Notes (Signed)
Pt c/o right flank and abd pains with n/v/d and urinary frequency for 3 days. Adds that she feel like her heart is racing. Hx kidney stone

## 2018-10-12 NOTE — ED Provider Notes (Signed)
[facility]-EMERGENCY DEPT Provider Note   CSN: [id number] Arrival date & time: 10/12/18  1349    History   Chief Complaint Chief Complaint  Patient presents with  . Flank Pain  . Emesis  . Diarrhea  . Urinary Frequency  . Tachycardia    HPI Lindsay Friedman is a 48 y.o. female with a past medical history of kidney stones, who presents today for evaluation of right-sided abdominal and flank pain.  She reports that for approximately 3 days she has been having dysuria and increased frequency.  Her pain worsened last night.  Since last night she has also developed nausea and vomiting with 4 episodes of vomiting, and 2 episodes of diarrhea.  She says that this pain feels like her usual kidney stones.  Now her urination has slowed and she reports minimal output.   She also reports that 3 days ago when her symptoms started she developed chest pressure.  She states that it intermittently feels like that her heart is beating rapidly and skipping beats.  She says the pain has not radiated or moved.  No alleviating or aggravating factors noted.    She does have a history of spontaneous pneumothorax x4.  These have been from ruptured blebs.  HPI  Past Medical History:  Diagnosis Date  . Migraines   . Pneumonia   . Pneumothorax     There are no active problems to display for this patient.   Past Surgical History:  Procedure Laterality Date  . ABDOMINAL HYSTERECTOMY    . LUNG SURGERY       OB History   No obstetric history on file.      Home Medications    Prior to Admission medications   Medication Sig Start Date End Date Taking? Authorizing Provider  acetaminophen (TYLENOL) 500 MG tablet Take 2,500-3,000 mg by mouth daily as needed for moderate pain.    Yes [provider]  Aspirin-Caffeine (BC FAST PAIN RELIEF PO) Take 4 packets by mouth daily as needed (pain).   Yes [provider]  HYDROcodone-acetaminophen (NORCO/VICODIN) 5-325 MG  tablet Take 1 tablet by mouth every 6 (six) hours as needed for severe pain. 10/12/18   Cristina GongHammond, Elizabeth W, PA-C  ondansetron (ZOFRAN ODT) 4 MG disintegrating tablet Take 1 tablet (4 mg total) by mouth every 8 (eight) hours as needed for nausea or vomiting. 10/12/18   Cristina GongHammond, Elizabeth W, PA-C  penicillin v potassium (VEETID) 500 MG tablet Take 1 tablet (500 mg total) by mouth 4 (four) times daily. Patient not taking: Reported on 10/12/2018 03/19/17   Minna AntisPaduchowski, Kevin, MD  tamsulosin (FLOMAX) 0.4 MG CAPS capsule Take 1 capsule (0.4 mg total) by mouth daily. 10/12/18   Cristina GongHammond, Elizabeth W, PA-C    Family History No family history on file.  Social History Social History   Tobacco Use  . Smoking status: Current Every Day Smoker    Packs/day: 0.50    Types: Cigarettes  . Smokeless tobacco: Never Used  Substance Use Topics  . Alcohol use: No  . Drug use: Yes    Types: Marijuana     Allergies   Bee venom   Review of Systems Review of Systems  Constitutional: Positive for chills. Negative for fever.  HENT: Negative for congestion.   Respiratory: Positive for chest tightness. Negative for shortness of breath.   Cardiovascular: Positive for chest pain and palpitations. Negative for leg swelling.  Gastrointestinal: Positive for abdominal pain, diarrhea, nausea and vomiting.  Genitourinary:  Positive for dysuria, flank pain and urgency. Negative for hematuria, menstrual problem, vaginal bleeding, vaginal discharge and vaginal pain.  Neurological: Negative for weakness and headaches.  All other systems reviewed and are negative.    Physical Exam Updated Vital Signs BP 123/83   Pulse 74   Temp 97.8 F (36.6 C) (Oral)   Resp 17   SpO2 98%   Physical Exam Vitals signs and nursing note reviewed.  Constitutional:      General: She is not in acute distress.    Appearance: She is well-developed. She is not ill-appearing.  HENT:     Head: Normocephalic and atraumatic.  Eyes:      Conjunctiva/sclera: Conjunctivae normal.  Neck:     Musculoskeletal: Normal range of motion and neck supple. No neck rigidity.  Cardiovascular:     Rate and Rhythm: Normal rate and regular rhythm.     Pulses: Normal pulses.     Heart sounds: Normal heart sounds. No murmur.  Pulmonary:     Effort: Pulmonary effort is normal. No respiratory distress.     Breath sounds: Normal breath sounds.  Abdominal:     General: Abdomen is flat. Bowel sounds are normal.     Palpations: Abdomen is soft.     Tenderness: There is abdominal tenderness. There is right CVA tenderness. There is no left CVA tenderness or rebound.     Hernia: No hernia is present.     Comments: Generalized TTP, worse in RUQ and RLQ.   Musculoskeletal: Normal range of motion.     Right lower leg: No edema.     Left lower leg: No edema.  Skin:    General: Skin is warm and dry.  Neurological:     General: No focal deficit present.     Mental Status: She is alert and oriented to person, place, and time. Mental status is at baseline.  Psychiatric:        Mood and Affect: Mood normal.        Behavior: Behavior normal.      ED Treatments / Results  Labs (all labs ordered are listed, but only abnormal results are displayed) Labs Reviewed  COMPREHENSIVE METABOLIC PANEL - Abnormal; Notable for the following components:      Result Value   Total Bilirubin 0.2 (*)    All other components within normal limits  CBC - Abnormal; Notable for the following components:   MCV 104.3 (*)    MCH 34.1 (*)    All other components within normal limits  URINALYSIS, ROUTINE W REFLEX MICROSCOPIC - Abnormal; Notable for the following components:   Ketones, ur 20 (*)    All other components within normal limits  URINALYSIS, COMPLETE (UACMP) WITH MICROSCOPIC - Abnormal; Notable for the following components:   Ketones, ur 20 (*)    All other components within normal limits  URINE CULTURE  LIPASE, BLOOD  TROPONIN I    EKG EKG  Interpretation  Date/Time:  Monday Oct 12 2018 14:02:39 EDT Ventricular Rate:  96 PR Interval:    QRS Duration: 82 QT Interval:  343 QTC Calculation: 434 R Axis:   87 Text Interpretation:  Sinus rhythm Baseline wander in lead(s) II III aVF No significant change since last tracing Confirmed by Linwood Dibbles [phone]) on 10/12/2018 2:35:23 PM   Radiology Dg Chest 2 View  Result Date: 10/12/2018 CLINICAL DATA:  Chest pain, history of spontaneous pneumothorax and blebs EXAM: CHEST - 2 VIEW COMPARISON:  None. FINDINGS: The heart size  and mediastinal contours are within normal limits. Mild pulmonary hyperinflation. Surgical suture in the right pulmonary apex. Probable emphysematous change and blebbing. The visualized skeletal structures are unremarkable. IMPRESSION: No acute abnormality of the lungs. Mild pulmonary hyperinflation. Surgical suture in the right pulmonary apex. Probable emphysematous change and blebbing. No evidence of pneumothorax. Electronically Signed   By: Lauralyn Primes M.D.   On: 10/12/2018 16:45   Ct Renal Stone Study  Result Date: 10/12/2018 CLINICAL DATA:  Right flank pain and dysuria EXAM: CT ABDOMEN AND PELVIS WITHOUT CONTRAST TECHNIQUE: Multidetector CT imaging of the abdomen and pelvis was performed following the standard protocol without oral or IV contrast. COMPARISON:  June 01, 2018 FINDINGS: Lower chest: There are small bullae in the lung bases with mild atelectasis. There is no lung base edema or consolidation. Hepatobiliary: A small portion of the dome of the liver is not imaged on this study. No focal lesions are evident in the visualized portions of the liver on this noncontrast enhanced study. Gallbladder wall is not appreciably thickened. There is no biliary duct dilatation. Pancreas: There is no pancreatic mass or inflammatory focus. Spleen: No splenic lesions are evident. Adrenals/Urinary Tract: Adrenals bilaterally appear normal. There is no appreciable renal mass or  hydronephrosis on either side. There is a 3 mm calculus in the distal right ureter near the ureterovesical junction without associated hydronephrosis. No other ureteral calculi evident. There are nearby phleboliths in the pelvis. Urinary bladder wall thickness is within normal limits. Stomach/Bowel: There are occasional sigmoid diverticula without diverticulitis. There is no appreciable bowel wall or mesenteric thickening. There is no evident bowel obstruction. There is no free air or portal venous air. Terminal ileum appears unremarkable. Vascular/Lymphatic: There is no abdominal aortic aneurysm. There are scattered foci of mild calcification in the distal aorta and common iliac artery on the left. No adenopathy is evident in the abdomen or pelvis. Reproductive: Uterus is absent.  There is no evident pelvic mass. Other: Appendix appears normal. There is no abscess or ascites in the abdomen or pelvis. Musculoskeletal: There is mild lumbar dextroscoliosis. No blastic or lytic bone lesions. No intramuscular or abdominal wall lesion evident. IMPRESSION: 1. 3 mm calculus distal right ureter without associated hydronephrosis. 2.  Occasional sigmoid diverticula without diverticulitis. 3.  Uterus absent. Electronically Signed   By: Bretta Bang III M.D.   On: 10/12/2018 17:29    Procedures Procedures (including critical care time)  Medications Ordered in ED Medications  sodium chloride 0.9 % bolus 500 mL (0 mLs Intravenous Stopped 10/12/18 1759)  morphine 4 MG/ML injection 4 mg (4 mg Intravenous Given 10/12/18 1612)  ondansetron (ZOFRAN) injection 4 mg (4 mg Intravenous Given 10/12/18 1610)  HYDROcodone-acetaminophen (NORCO/VICODIN) 5-325 MG per tablet 1 tablet (1 tablet Oral Given 10/12/18 1808)     Initial Impression / Assessment and Plan / ED Course  I have reviewed the triage vital signs and the nursing notes.  Pertinent labs & imaging results that were available during my care of the patient were  reviewed by me and considered in my medical decision making (see chart for details).  Clinical Course as of Oct 11 2309  Mon Oct 12, 2018  1651 No pneumothorax, CT renal stone study ordered.   DG Chest 2 View [EH]    Clinical Course User Index [EH] Cristina Gong, PA-C      Patient presents today for evaluation of palpitations, and right-sided abdominal pain.  Urine was obtained with 20 ketones without other abnormalities.  CBC and CMP without significant electrolyte or hematologic derangements.  Lipase is not elevated.  She states that this feels like previous kidney stones.  CT renal stone study was ordered which showed a right sided 3 mm calculus in the distal right ureter without associated hydronephrosis along with diverticuli without diverticulitis.  Both of this diagnoses were discussed with the patient.  She also reported feeling like she has chest tightness and palpitations.  Chest x-ray was obtained without evidence of pneumothorax, consolidation, or other acute abnormalities.  Troponin is not elevated.  Based on her pain being ongoing for 3 days delta troponin is not indicated.  She has a low risk heart score.  PERC negative.  Recommended outpatient follow-up.  Her pain was treated in the emergency room with morphine, Norco, and she was given Zofran.  She is given urology follow-up as needed.  Given prescriptions for Flomax, Zofran, and Vicodin.  Return precautions were discussed with patient who states their understanding.  At the time of discharge patient denied any unaddressed complaints or concerns.  Patient is agreeable for discharge home.   Final Clinical Impressions(s) / ED Diagnoses   Final diagnoses:  Right ureteral stone  Palpitations    ED Discharge Orders         Ordered    tamsulosin (FLOMAX) 0.4 MG CAPS capsule  Daily     10/12/18 1820    ondansetron (ZOFRAN ODT) 4 MG disintegrating tablet  Every 8 hours PRN     10/12/18 1820     HYDROcodone-acetaminophen (NORCO/VICODIN) 5-325 MG tablet  Every 6 hours PRN     10/12/18 1820           Cristina Gong, Cordelia Poche 10/12/18 2313    Rolan Bucco, MD 10/12/18 2328

## 2018-10-12 NOTE — ED Notes (Signed)
Elizabeth, PA at bedside.  

## 2018-10-12 NOTE — ED Notes (Signed)
Patient updated on plan of care

## 2018-10-13 LAB — URINE CULTURE: Culture: NO GROWTH

## 2018-11-03 ENCOUNTER — Other Ambulatory Visit: Payer: Self-pay

## 2018-11-03 ENCOUNTER — Ambulatory Visit: Payer: Self-pay | Attending: Nurse Practitioner | Admitting: Nurse Practitioner

## 2019-02-15 ENCOUNTER — Encounter (HOSPITAL_COMMUNITY): Payer: Self-pay | Admitting: Emergency Medicine

## 2019-02-15 ENCOUNTER — Emergency Department (HOSPITAL_COMMUNITY): Payer: Self-pay

## 2019-02-15 ENCOUNTER — Emergency Department (HOSPITAL_COMMUNITY)
Admission: EM | Admit: 2019-02-15 | Discharge: 2019-02-15 | Disposition: A | Discharge status: Home / Self Care | Payer: Self-pay | Attending: Emergency Medicine | Admitting: Emergency Medicine

## 2019-02-15 ENCOUNTER — Other Ambulatory Visit: Payer: Self-pay

## 2019-02-15 DIAGNOSIS — F17210 Nicotine dependence, cigarettes, uncomplicated: Secondary | ICD-10-CM | POA: Insufficient documentation

## 2019-02-15 DIAGNOSIS — Z79899 Other long term (current) drug therapy: Secondary | ICD-10-CM | POA: Insufficient documentation

## 2019-02-15 DIAGNOSIS — J441 Chronic obstructive pulmonary disease with (acute) exacerbation: Secondary | ICD-10-CM | POA: Insufficient documentation

## 2019-02-15 HISTORY — DX: Chronic obstructive pulmonary disease, unspecified: J44.9

## 2019-02-15 LAB — COMPREHENSIVE METABOLIC PANEL
ALT: 9 U/L (ref 0–44)
AST: 18 U/L (ref 15–41)
Albumin: 4 g/dL (ref 3.5–5.0)
Alkaline Phosphatase: 60 U/L (ref 38–126)
Anion gap: 10 (ref 5–15)
BUN: <5 mg/dL — ABNORMAL LOW (ref 6–20)
CO2: 25 mmol/L (ref 22–32)
Calcium: 8.9 mg/dL (ref 8.9–10.3)
Chloride: 107 mmol/L (ref 98–111)
Creatinine, Ser: 0.73 mg/dL (ref 0.44–1.00)
GFR calc Af Amer: >60 mL/min (ref >60)
GFR calc non Af Amer: >60 mL/min (ref >60)
Glucose, Bld: 98 mg/dL (ref 70–99)
Potassium: 3.8 mmol/L (ref 3.5–5.1)
Sodium: 142 mmol/L (ref 135–145)
Total Bilirubin: 0.2 mg/dL — ABNORMAL LOW (ref 0.3–1.2)
Total Protein: 6.6 g/dL (ref 6.5–8.1)

## 2019-02-15 LAB — CBC WITH DIFFERENTIAL/PLATELET
Abs Immature Granulocytes: 0.02 10*3/uL (ref 0.00–0.07)
Basophils Absolute: 0 10*3/uL (ref 0.0–0.1)
Basophils Relative: 0 %
Eosinophils Absolute: 0 10*3/uL (ref 0.0–0.5)
Eosinophils Relative: 1 %
HCT: 40 % (ref 36.0–46.0)
Hemoglobin: 13.6 g/dL (ref 12.0–15.0)
Immature Granulocytes: 0 %
Lymphocytes Relative: 31 %
Lymphs Abs: 2.7 10*3/uL (ref 0.7–4.0)
MCH: 35.1 pg — ABNORMAL HIGH (ref 26.0–34.0)
MCHC: 34 g/dL (ref 30.0–36.0)
MCV: 103.4 fL — ABNORMAL HIGH (ref 80.0–100.0)
Monocytes Absolute: 0.4 10*3/uL (ref 0.1–1.0)
Monocytes Relative: 5 %
Neutro Abs: 5.4 10*3/uL (ref 1.7–7.7)
Neutrophils Relative %: 63 %
Platelets: 230 10*3/uL (ref 150–400)
RBC: 3.87 MIL/uL (ref 3.87–5.11)
RDW: 12.6 % (ref 11.5–15.5)
WBC: 8.6 10*3/uL (ref 4.0–10.5)
nRBC: 0 % (ref 0.0–0.2)

## 2019-02-15 LAB — D-DIMER, QUANTITATIVE: D-Dimer, Quant: <0.27 ug/mL-FEU (ref 0.00–0.50)

## 2019-02-15 LAB — I-STAT BETA HCG BLOOD, ED (MC, WL, AP ONLY): I-stat hCG, quantitative: 9 m[IU]/mL — ABNORMAL HIGH (ref <5)

## 2019-02-15 LAB — MAGNESIUM: Magnesium: 2 mg/dL (ref 1.7–2.4)

## 2019-02-15 LAB — PROTIME-INR
INR: 1.1 (ref 0.8–1.2)
Prothrombin Time: 13.9 seconds (ref 11.4–15.2)

## 2019-02-15 LAB — BRAIN NATRIURETIC PEPTIDE: B Natriuretic Peptide: 9.1 pg/mL (ref 0.0–100.0)

## 2019-02-15 MED ORDER — HYDROMORPHONE HCL 1 MG/ML IJ SOLN
1.0000 mg | Freq: Once | INTRAMUSCULAR | Status: AC
  Administered 2019-02-15: 1 mg via INTRAVENOUS
  Filled 2019-02-15: qty 1

## 2019-02-15 MED ORDER — FENTANYL CITRATE (PF) 100 MCG/2ML IJ SOLN
25.0000 ug | Freq: Once | INTRAMUSCULAR | Status: AC
  Administered 2019-02-15: 25 ug via INTRAVENOUS
  Filled 2019-02-15: qty 2

## 2019-02-15 MED ORDER — PREDNISONE 20 MG PO TABS
60.0000 mg | ORAL_TABLET | ORAL | Status: AC
  Administered 2019-02-15: 60 mg via ORAL
  Filled 2019-02-15: qty 3

## 2019-02-15 MED ORDER — PREDNISONE 20 MG PO TABS: 40.0000 mg | ORAL_TABLET | Freq: Every day | ORAL | 0 refills | Status: DC

## 2019-02-15 MED ORDER — ALBUTEROL SULFATE HFA 108 (90 BASE) MCG/ACT IN AERS
2.0000 | INHALATION_SPRAY | Freq: Four times a day (QID) | RESPIRATORY_TRACT | Status: DC
  Administered 2019-02-15: 2 via RESPIRATORY_TRACT
  Filled 2019-02-15: qty 6.7

## 2019-02-15 MED ORDER — SODIUM CHLORIDE 0.9 % IV SOLN
INTRAVENOUS | Status: DC
  Administered 2019-02-15: 18:00:00 via INTRAVENOUS

## 2019-02-15 NOTE — ED Triage Notes (Signed)
Pt presents from home via EMS for SOB, night sweat, chills, pain with inspiration, cough with bloody sputum x1wk that has been worse for the past 2 days. 45 min ago felt a "pop" under L ribs that radiated to back and felt similar to pneumothorax/blebs in past. H/o COPD, smoker, pleurisy. Only home med is albuterol inhaler, used last just prior to calling for EMS.  EMS exam- EKG unremarkable , lungs clears in all fields,

## 2019-02-15 NOTE — ED Notes (Signed)
Family at bedside. 

## 2019-02-15 NOTE — ED Notes (Signed)
Pt sats 96% on RA, requests O2 for comfort, provided 2 L. 99% on 2L.

## 2019-02-15 NOTE — ED Notes (Signed)
Pt discharged from ED; instructions provided and scripts given; Pt encouraged to return to ED if symptoms worsen and to f/u with PCP; Pt verbalized understanding of all instructions 

## 2019-02-15 NOTE — ED Provider Notes (Signed)
[facility]   CSN: [id number] Arrival date & time: 02/15/19  1642     History   Chief Complaint Chief Complaint  Patient presents with   Shortness of Breath    HPI Lindsay Friedman is a 48 y.o. female.     HPI  Patient with a history of COPD, with continued smoking addiction presents with dyspnea, hemoptysis. Patient notes that she has a history of blebs, pneumothoraces as well. Last pneumothorax was, almost 2 decades ago. Over the past week, worse over the past 2 days she has had hemoptysis. There is been some mild dyspnea, but today, about 1 hour prior to ED arrival she felt a distinct pop, and since that time he has had worsening dyspnea. Some discomfort in the left lateral chest wall, but chief complaint seems to be more dyspnea. No fever, no change in chronic cough. No medication taken for relief and no clear alleviating or exacerbating factors beyond activity, which increases her dyspnea.  Past Medical History:  Diagnosis Date   COPD (chronic obstructive pulmonary disease) (HCC)    Migraines    Pneumonia    Pneumothorax     There are no active problems to display for this patient.   Past Surgical History:  Procedure Laterality Date   ABDOMINAL HYSTERECTOMY     LUNG SURGERY       OB History   No obstetric history on file.      Home Medications    Prior to Admission medications   Medication Sig Start Date End Date Taking? Authorizing Provider  acetaminophen (TYLENOL) 500 MG tablet Take 2,500-3,000 mg by mouth daily as needed for moderate pain.     [provider]  Aspirin-Caffeine (BC FAST PAIN RELIEF PO) Take 4 packets by mouth daily as needed (pain).    [provider]  HYDROcodone-acetaminophen (NORCO/VICODIN) 5-325 MG tablet Take 1 tablet by mouth every 6 (six) hours as needed for severe pain. 10/12/18   Cristina GongHammond, Elizabeth W, PA-C  ondansetron (ZOFRAN ODT) 4 MG  disintegrating tablet Take 1 tablet (4 mg total) by mouth every 8 (eight) hours as needed for nausea or vomiting. 10/12/18   Cristina GongHammond, Elizabeth W, PA-C  penicillin v potassium (VEETID) 500 MG tablet Take 1 tablet (500 mg total) by mouth 4 (four) times daily. Patient not taking: Reported on 10/12/2018 03/19/17   Minna AntisPaduchowski, Kevin, MD  tamsulosin (FLOMAX) 0.4 MG CAPS capsule Take 1 capsule (0.4 mg total) by mouth daily. 10/12/18   Cristina GongHammond, Elizabeth W, PA-C    Family History No family history on file.  Social History Social History   Tobacco Use   Smoking status: Current Every Day Smoker    Packs/day: 0.50    Types: Cigarettes   Smokeless tobacco: Never Used  Substance Use Topics   Alcohol use: No   Drug use: Yes    Types: Marijuana     Allergies   Bee venom   Review of Systems Review of Systems  Constitutional:       Per HPI, otherwise negative  HENT:       Per HPI, otherwise negative  Respiratory:       Per HPI, otherwise negative  Cardiovascular:       Per HPI, otherwise negative  Gastrointestinal: Negative for vomiting.  Endocrine:       Negative aside from HPI  Genitourinary:       Neg aside from HPI   Musculoskeletal:  Per HPI, otherwise negative  Skin: Negative.   Neurological: Negative for syncope.     Physical Exam Updated Vital Signs BP 109/82    Pulse 76    Resp (!) 6    Ht 5\' 2"  (1.575 m)    Wt 48.5 kg    SpO2 99%    BMI 19.57 kg/m   Physical Exam Vitals signs and nursing note reviewed.  Constitutional:      Appearance: She is well-developed. She is ill-appearing and diaphoretic.     Comments: Uncomfortable appearing thin adult female awake and alert  HENT:     Head: Normocephalic and atraumatic.  Eyes:     Conjunctiva/sclera: Conjunctivae normal.  Cardiovascular:     Rate and Rhythm: Regular rhythm. Tachycardia present.  Pulmonary:     Effort: Tachypnea present.     Breath sounds: Examination of the left-upper field reveals  decreased breath sounds. Examination of the left-middle field reveals decreased breath sounds. Examination of the left-lower field reveals decreased breath sounds. Decreased breath sounds present.  Abdominal:     General: There is no distension.  Skin:    General: Skin is warm.  Neurological:     Mental Status: She is alert and oriented to person, place, and time.     Cranial Nerves: No cranial nerve deficit.      ED Treatments / Results  Labs (all labs ordered are listed, but only abnormal results are displayed) Labs Reviewed  COMPREHENSIVE METABOLIC PANEL - Abnormal; Notable for the following components:      Result Value   BUN <5 (*)    Total Bilirubin 0.2 (*)    All other components within normal limits  CBC WITH DIFFERENTIAL/PLATELET - Abnormal; Notable for the following components:   MCV 103.4 (*)    MCH 35.1 (*)    All other components within normal limits  I-STAT BETA HCG BLOOD, ED ([facility], [facility], [facility] ONLY) - Abnormal; Notable for the following components:   I-stat hCG, quantitative 9.0 (*)    All other components within normal limits  MAGNESIUM  BRAIN NATRIURETIC PEPTIDE  PROTIME-INR  D-DIMER, QUANTITATIVE (NOT AT [facility])  CBG MONITORING, ED    EKG EKG Interpretation  Date/Time:  Monday February 15 2019 16:46:25 EDT Ventricular Rate:  90 PR Interval:    QRS Duration: 76 QT Interval:  346 QTC Calculation: 424 R Axis:   87 Text Interpretation:  Sinus rhythm Baseline wander Otherwise within normal limits Confirmed by 01-09-1969 [phone]) on 02/15/2019 4:56:06 PM   Radiology Ct Chest Wo Contrast  Result Date: 02/15/2019 CLINICAL DATA:  Left-sided pain with dyspnea. COPD exacerbation. History of blebs and pneumothorax. EXAM: CT CHEST WITHOUT CONTRAST TECHNIQUE: Multidetector CT imaging of the chest was performed following the standard protocol without IV contrast. COMPARISON:  Radiograph earlier this day. No prior chest CT available. FINDINGS: Cardiovascular: Thoracic  aorta is normal in caliber. Normal heart size. No pericardial fluid. Mediastinum/Nodes: No mediastinal adenopathy. Subcentimeter mediastinal nodes not enlarged by size criteria. No evidence of hilar adenopathy on noncontrast exam. Visualized thyroid gland is normal. No esophageal wall thickening. Lungs/Pleura: Moderate paraseptal emphysema. Multiple small blebs throughout both lungs. No pneumothorax. No focal airspace disease, pulmonary edema, or pleural fluid. Trachea and mainstem bronchi are patent. Mild biapical pleuroparenchymal scarring. Chain suture in the right upper lobe. No pulmonary mass. Upper Abdomen: No acute findings Musculoskeletal: There are no acute or suspicious osseous abnormalities. IMPRESSION: 1. Moderate emphysema.  Multiple small blebs throughout both lungs. 2. No acute  findings or pulmonary mass. Emphysema (ICD10-J43.9). Electronically Signed   By: Keith Rake M.D.   On: 02/15/2019 21:32   Dg Chest Port 1 View  Result Date: 02/15/2019 CLINICAL DATA:  Shortness of breath, night sweats, chills, pain with inspiration, cough with bloody sputum, history COPD EXAM: PORTABLE CHEST 1 VIEW COMPARISON:  Portable exam 1739 hours compared to 10/12/2018 FINDINGS: Normal heart size, mediastinal contours, and pulmonary vascularity. Emphysematous and minimal bronchitic changes consistent with COPD. RIGHT upper lobe scarring. No infiltrate, pleural effusion or pneumothorax. No acute osseous findings. IMPRESSION: COPD changes with RIGHT upper lobe scarring. No acute abnormalities. Electronically Signed   By: Lavonia Dana M.D.   On: 02/15/2019 18:15    Procedures Procedures (including critical care time)  Medications Ordered in ED Medications  0.9 %  sodium chloride infusion ( Intravenous New Bag/Given 02/15/19 1738)  HYDROmorphone (DILAUDID) injection 1 mg (has no administration in time range)  predniSONE (DELTASONE) tablet 60 mg (has no administration in time range)  albuterol (VENTOLIN HFA)  108 (90 Base) MCG/ACT inhaler 2 puff (has no administration in time range)  fentaNYL (SUBLIMAZE) injection 25 mcg (25 mcg Intravenous Given 02/15/19 1733)  HYDROmorphone (DILAUDID) injection 1 mg (1 mg Intravenous Given 02/15/19 1903)     Initial Impression / Assessment and Plan / ED Course  I have reviewed the triage vital signs and the nursing notes.  Pertinent labs & imaging results that were available during my care of the patient were reviewed by me and considered in my medical decision making (see chart for details).    After the initial studies I discussed the patient's initial x-ray with our radiology staff, with concern for left versus pneumothorax. Per report x-ray and consistent with pneumothorax.    10:32 PM On repeat exam the patient is now accompanied by a female companion. She remains in no distress, no hypoxia, tachypnea, tachycardia. I reviewed the CT imaging myself, and demonstrated the images to the patient and her companion.  I we discussed her history of COPD, smoking addiction, and changes consistent with this. No evidence for pneumothorax, suspicion for a popped bleb, pleurisy contributing to her pain, dyspnea. With no fever, no new oxygen requirement, patient is appropriate for discharge with outpatient follow-up She notes that she has a primary care appointment scheduled for tomorrow. Patient started on steroids, provided albuterol, discharged in stable condition with outpatient follow-up.  Final Clinical Impressions(s) / ED Diagnoses   Final diagnoses:  COPD exacerbation [facility])    ED Discharge Orders         Ordered    predniSONE (DELTASONE) 20 MG tablet  Daily with breakfast     02/15/19 2234           Carmin Muskrat, MD 02/15/19 2234

## 2019-02-15 NOTE — Discharge Instructions (Addendum)
As discussed, your evaluation today has been largely reassuring.  But, it is important that you monitor your condition carefully, and do not hesitate to return to the ED if you develop new, or concerning changes in your condition. ? ?Otherwise, please follow-up with your physician for appropriate ongoing care. ? ?

## 2019-02-16 ENCOUNTER — Ambulatory Visit: Payer: Self-pay | Attending: Nurse Practitioner | Admitting: Nurse Practitioner

## 2019-02-16 ENCOUNTER — Other Ambulatory Visit: Payer: Self-pay

## 2019-02-16 ENCOUNTER — Encounter: Payer: Self-pay | Admitting: Nurse Practitioner

## 2019-02-16 DIAGNOSIS — J441 Chronic obstructive pulmonary disease with (acute) exacerbation: Secondary | ICD-10-CM

## 2019-02-16 DIAGNOSIS — G43C0 Periodic headache syndromes in child or adult, not intractable: Secondary | ICD-10-CM

## 2019-02-16 DIAGNOSIS — Z7689 Persons encountering health services in other specified circumstances: Secondary | ICD-10-CM

## 2019-02-16 DIAGNOSIS — F419 Anxiety disorder, unspecified: Secondary | ICD-10-CM

## 2019-02-16 DIAGNOSIS — F17200 Nicotine dependence, unspecified, uncomplicated: Secondary | ICD-10-CM

## 2019-02-16 MED ORDER — NICOTINE 14 MG/24HR TD PT24: 14.0000 mg | MEDICATED_PATCH | Freq: Every day | TRANSDERMAL | 0 refills | Status: AC

## 2019-02-16 MED ORDER — HYDROXYZINE HCL 25 MG PO TABS: 25.0000 mg | ORAL_TABLET | Freq: Three times a day (TID) | ORAL | 1 refills | Status: AC | PRN

## 2019-02-16 MED ORDER — NICOTINE 7 MG/24HR TD PT24: 7.0000 mg | MEDICATED_PATCH | Freq: Every day | TRANSDERMAL | 0 refills | Status: DC

## 2019-02-16 MED ORDER — TOPIRAMATE 50 MG PO TABS: 50.0000 mg | ORAL_TABLET | Freq: Two times a day (BID) | ORAL | 1 refills | Status: DC

## 2019-02-16 MED ORDER — NICOTINE 21 MG/24HR TD PT24: 21.0000 mg | MEDICATED_PATCH | Freq: Every day | TRANSDERMAL | 0 refills | Status: AC

## 2019-02-16 MED ORDER — SEREVENT DISKUS 50 MCG/DOSE IN AEPB: 1.0000 | INHALATION_SPRAY | Freq: Two times a day (BID) | RESPIRATORY_TRACT | 6 refills | Status: DC

## 2019-02-16 NOTE — Progress Notes (Signed)
Virtual Visit via Telephone Note Due to national recommendations of social distancing due to [place] 19, telehealth visit is felt to be most appropriate for this patient at this time.  I discussed the limitations, risks, security and privacy concerns of performing an evaluation and management service by telephone and the availability of in person appointments. I also discussed with the patient that there may be a patient responsible charge related to this service. The patient expressed understanding and agreed to proceed.    I connected with Lindsay Friedman on 02/16/19  at  10:50 AM EDT  EDT by telephone and verified that I am speaking with the correct person using two identifiers.   Consent I discussed the limitations, risks, security and privacy concerns of performing an evaluation and management service by telephone and the availability of in person appointments. I also discussed with the patient that there may be a patient responsible charge related to this service. The patient expressed understanding and agreed to proceed.   Location of Patient: Private Residence    Location of Provider: [place] and [employer] Office    Persons participating in Telemedicine visit: Geryl Rankins FNP-BC [place]    History of Present Illness: Telemedicine visit for: Establish Care  has a past medical history of COPD (chronic obstructive pulmonary disease) ([place]), Migraines, Pneumonia, and Pneumothorax. She has not a mammogram in 7 years.  She had hysterectomy due to endometriosis     COPD She was evaluated and treated in the ED yesterday for COPD exacerbation. She was noted for dyspnea and reported complaints of hemoptysis. She has a history of blebs/pneumothoraces. Last PTX 20 years ago. She was treated with IVFs, IV dilaudid, prednisone, albuterol and IV fentanyl in the ED. Xray in the ED was reviewed and did not show PTX, pleurisy, popped bleb or PNA. As she was  medically stable with no fever or O2 requirements she was discharged and instructed to stop smoking. She was also prescribed po prednisone which she has still not picked up as of today.  States she has not had a cigarette since she went to the ED. Using nicotine gum. Will order nicotine patches today and inhaler.    Anxiety: Patient complains of anxiety disorder.  She has the following symptoms: feelings of losing control, irritable, shortness of breath. Symptoms are chronic and stable.  She denies current suicidal and homicidal ideation. Possible organic causes contributing are: none, poorly controlled COPD .   Migraines Occurring several times per week. Not related to her menstrual cycles. She usually takes ASA, goody/BC powders. I have reccommended that she stop ASA products immediately. She also has tried excedrin migraine however states this causes jitteriness.      Past Medical History:  Diagnosis Date  . COPD (chronic obstructive pulmonary disease) ([place])   . Migraines   . Pneumonia   . Pneumothorax     Past Surgical History:  Procedure Laterality Date  . ABDOMINAL HYSTERECTOMY    . LUNG SURGERY      History reviewed. No pertinent family history.  Social History   Socioeconomic History  . Marital status: Single    Spouse name: Not on file  . Number of children: Not on file  . Years of education: Not on file  . Highest education level: Not on file  Occupational History  . Not on file  Social Needs  . Financial resource strain: Not on file  . Food insecurity    Worry: Not on  file    Inability: Not on file  . Transportation needs    Medical: Not on file    Non-medical: Not on file  Tobacco Use  . Smoking status: Current Every Day Smoker    Packs/day: 0.50    Types: Cigarettes  . Smokeless tobacco: Never Used  Substance and Sexual Activity  . Alcohol use: No  . Drug use: Yes    Types: Marijuana  . Sexual activity: Yes  Lifestyle  . Physical activity    Days  per week: Not on file    Minutes per session: Not on file  . Stress: Not on file  Relationships  . Social Musician on phone: Not on file    Gets together: Not on file    Attends religious service: Not on file    Active member of club or organization: Not on file    Attends meetings of clubs or organizations: Not on file    Relationship status: Not on file  Other Topics Concern  . Not on file  Social History Narrative  . Not on file     Observations/Objective: Awake, alert and oriented x 3   Review of Systems  Constitutional: Negative for fever, malaise/fatigue and weight loss.  HENT: Negative.  Negative for nosebleeds.   Eyes: Negative.  Negative for blurred vision, double vision and photophobia.  Respiratory: Positive for cough, sputum production and shortness of breath. Negative for wheezing.   Cardiovascular: Negative.  Negative for chest pain, palpitations and leg swelling.  Gastrointestinal: Negative.  Negative for heartburn, nausea and vomiting.  Musculoskeletal: Negative.  Negative for myalgias.  Neurological: Positive for headaches. Negative for dizziness, focal weakness and seizures.  Psychiatric/Behavioral: Negative for suicidal ideas. The patient is nervous/anxious.     Assessment and Plan:  Diagnoses and all orders for this visit:  Encounter to establish care  Tobacco dependence -     nicotine (NICODERM CQ - DOSED IN MG/24 HOURS) 21 mg/24hr patch; Place 1 patch (21 mg total) onto the skin daily. -     nicotine (NICODERM CQ - DOSED IN MG/24 HR) 7 mg/24hr patch; Place 1 patch (7 mg total) onto the skin daily. -     nicotine (NICODERM CQ - DOSED IN MG/24 HOURS) 14 mg/24hr patch; Place 1 patch (14 mg total) onto the skin daily for 28 days.  Chronic obstructive pulmonary disease with acute exacerbation (HCC) -     salmeterol (SEREVENT DISKUS) 50 MCG/DOSE diskus inhaler; Inhale 1 puff into the lungs 2 (two) times daily.  Periodic headache syndrome, not  intractable -     topiramate (TOPAMAX) 50 MG tablet; Take 1 tablet (50 mg total) by mouth 2 (two) times daily.  Anxiety -     hydrOXYzine (ATARAX/VISTARIL) 25 MG tablet; Take 1 tablet (25 mg total) by mouth every 8 (eight) hours as needed for anxiety.     Follow Up Instructions Return in about 4 weeks (around 03/16/2019).     I discussed the assessment and treatment plan with the patient. The patient was provided an opportunity to ask questions and all were answered. The patient agreed with the plan and demonstrated an understanding of the instructions.   The patient was advised to call back or seek an in-person evaluation if the symptoms worsen or if the condition fails to improve as anticipated.  I provided 24 minutes of non-face-to-face time during this encounter including median intraservice time, reviewing previous notes, labs, imaging, medications and explaining diagnosis and  management.  Gildardo Pounds, FNP-BC

## 2019-02-21 ENCOUNTER — Encounter: Payer: Self-pay | Admitting: Nurse Practitioner

## 2019-03-16 ENCOUNTER — Ambulatory Visit: Payer: Medicaid Other | Admitting: Nurse Practitioner

## 2019-04-26 MED FILL — hydrOXYzine HCL 25 MG TABS: 25 | 20 days supply | Qty: 60 | Fill #1

## 2019-04-30 ENCOUNTER — Other Ambulatory Visit: Payer: Self-pay

## 2019-04-30 DIAGNOSIS — Z20822 Contact with and (suspected) exposure to covid-19: Secondary | ICD-10-CM

## 2019-05-01 LAB — NOVEL CORONAVIRUS, NAA: SARS-CoV-2, NAA: NOT DETECTED

## 2019-05-03 ENCOUNTER — Telehealth (HOSPITAL_COMMUNITY): Payer: Self-pay

## 2019-05-03 NOTE — Telephone Encounter (Signed)
Telephoned patient at home number. Left message for patient to call and schedule with [facility]. °

## 2019-05-07 ENCOUNTER — Telehealth: Payer: Self-pay | Admitting: *Deleted

## 2019-05-07 NOTE — Telephone Encounter (Signed)
Called patient due to her request to schedule an appt but was not able to reach patient. Patient daughter Lindsay Friedman picked up and stated patient was not there and will have patient call office when she comes back.

## 2019-05-28 ENCOUNTER — Ambulatory Visit: Payer: Medicaid Other | Attending: Internal Medicine

## 2019-05-28 DIAGNOSIS — Z20822 Contact with and (suspected) exposure to covid-19: Secondary | ICD-10-CM

## 2019-05-30 LAB — NOVEL CORONAVIRUS, NAA: SARS-CoV-2, NAA: NOT DETECTED

## 2019-06-09 ENCOUNTER — Other Ambulatory Visit: Payer: Self-pay

## 2019-06-09 ENCOUNTER — Ambulatory Visit: Payer: Self-pay | Attending: Nurse Practitioner | Admitting: Physician Assistant

## 2019-06-09 DIAGNOSIS — J441 Chronic obstructive pulmonary disease with (acute) exacerbation: Secondary | ICD-10-CM

## 2019-06-09 DIAGNOSIS — G43C0 Periodic headache syndromes in child or adult, not intractable: Secondary | ICD-10-CM

## 2019-06-09 MED ORDER — ALBUTEROL SULFATE HFA 108 (90 BASE) MCG/ACT IN AERS: 2.0000 | INHALATION_SPRAY | Freq: Four times a day (QID) | RESPIRATORY_TRACT | 1 refills | Status: DC | PRN

## 2019-06-09 MED ORDER — PREDNISONE 10 MG PO TABS: ORAL_TABLET | ORAL | 0 refills | Status: DC

## 2019-06-09 MED ORDER — SEREVENT DISKUS 50 MCG/DOSE IN AEPB: 1.0000 | INHALATION_SPRAY | Freq: Two times a day (BID) | RESPIRATORY_TRACT | 6 refills | Status: DC

## 2019-06-09 NOTE — Progress Notes (Signed)
Pt. Is having weakness, sore throat, and cough.  Pt. Also is complaining of migraine.

## 2019-06-09 NOTE — Progress Notes (Signed)
Patient ID: Lindsay Friedman, female   DOB: 08/02/1970, 49 y.o.   MRN: [id number] Virtual Visit via Telephone Note  I connected with Serena Colonel on 06/09/19 at  3:10 PM EST by telephone and verified that I am speaking with the correct person using two identifiers.   I discussed the limitations, risks, security and privacy concerns of performing an evaluation and management service by telephone and the availability of in person appointments. I also discussed with the patient that there may be a patient responsible charge related to this service. The patient expressed understanding and agreed to proceed.  PATIENT visit by telephone virtually in the context of Covid-19 pandemic. Patient location:  home My Location:  [facility] office Persons on the call:  Me and the patient    History of Present Illness:  Patient complaining of shortness of breath and wheezing since she ran out of all her inhalers and quit smoking 2 months ago.  No fever.  She had ST and cough and was just tested for Covid and it was neg.  Denies respiratory distress.  Says topamax is not really as effective for HA any more.  HA unchanged.  No vision changes, no dizziness.  She says she has had previous work up in the past.      Observations/Objective:  A&Ox3   Assessment and Plan: 1. Chronic obstructive pulmonary disease with acute exacerbation (HCC) Great work on smoking cessation.  Prednisone to calm down wheezing and resume serevent.  Use albuterol prn - salmeterol (SEREVENT DISKUS) 50 MCG/DOSE diskus inhaler; Inhale 1 puff into the lungs 2 (two) times daily.  Dispense: 60 each; Refill: 6 - albuterol (VENTOLIN HFA) 108 (90 Base) MCG/ACT inhaler; Inhale 2 puffs into the lungs every 6 (six) hours as needed for wheezing or shortness of breath.  Dispense: 18 g; Refill: 1 - predniSONE (DELTASONE) 10 MG tablet; 6,5,4,3,2,1 take each days dose in am with food  Dispense: 21 tablet; Refill: 0  2. Periodic headache syndrome, not  intractable No red flags today.  Continue topamax.  Keep HA diary.  If severe HA, to ED  Follow Up Instructions: See PCP in 3 weeks   I discussed the assessment and treatment plan with the patient. The patient was provided an opportunity to ask questions and all were answered. The patient agreed with the plan and demonstrated an understanding of the instructions.   The patient was advised to call back or seek an in-person evaluation if the symptoms worsen or if the condition fails to improve as anticipated.  I provided 9 minutes of non-face-to-face time during this encounter.   Georgian Co, PA-C

## 2019-08-31 ENCOUNTER — Other Ambulatory Visit (HOSPITAL_COMMUNITY)
Admission: RE | Admit: 2019-08-31 | Discharge: 2019-08-31 | Disposition: A | Discharge status: Home / Self Care | Payer: Medicaid Other | Source: Ambulatory Visit | Attending: Nurse Practitioner | Admitting: Nurse Practitioner

## 2019-08-31 ENCOUNTER — Encounter: Payer: Self-pay | Admitting: Nurse Practitioner

## 2019-08-31 ENCOUNTER — Ambulatory Visit: Payer: Self-pay | Attending: Nurse Practitioner | Admitting: Nurse Practitioner

## 2019-08-31 ENCOUNTER — Other Ambulatory Visit: Payer: Self-pay

## 2019-08-31 VITALS — BP 107/72 | HR 87 | Temp 97.7°F | Wt 105.0 lb

## 2019-08-31 DIAGNOSIS — Z114 Encounter for screening for human immunodeficiency virus [HIV]: Secondary | ICD-10-CM | POA: Insufficient documentation

## 2019-08-31 DIAGNOSIS — R110 Nausea: Secondary | ICD-10-CM | POA: Insufficient documentation

## 2019-08-31 DIAGNOSIS — Z79899 Other long term (current) drug therapy: Secondary | ICD-10-CM | POA: Insufficient documentation

## 2019-08-31 DIAGNOSIS — R102 Pelvic and perineal pain: Secondary | ICD-10-CM | POA: Insufficient documentation

## 2019-08-31 DIAGNOSIS — Z13220 Encounter for screening for lipoid disorders: Secondary | ICD-10-CM | POA: Insufficient documentation

## 2019-08-31 DIAGNOSIS — R300 Dysuria: Secondary | ICD-10-CM | POA: Insufficient documentation

## 2019-08-31 DIAGNOSIS — R1030 Lower abdominal pain, unspecified: Secondary | ICD-10-CM | POA: Insufficient documentation

## 2019-08-31 DIAGNOSIS — M545 Low back pain, unspecified: Secondary | ICD-10-CM

## 2019-08-31 DIAGNOSIS — L818 Other specified disorders of pigmentation: Secondary | ICD-10-CM

## 2019-08-31 DIAGNOSIS — R339 Retention of urine, unspecified: Secondary | ICD-10-CM | POA: Insufficient documentation

## 2019-08-31 DIAGNOSIS — Z130 Encounter for screening for diseases of the blood and blood-forming organs and certain disorders involving the immune mechanism: Secondary | ICD-10-CM | POA: Insufficient documentation

## 2019-08-31 DIAGNOSIS — J449 Chronic obstructive pulmonary disease, unspecified: Secondary | ICD-10-CM | POA: Insufficient documentation

## 2019-08-31 DIAGNOSIS — Z7689 Persons encountering health services in other specified circumstances: Secondary | ICD-10-CM | POA: Insufficient documentation

## 2019-08-31 DIAGNOSIS — R3914 Feeling of incomplete bladder emptying: Secondary | ICD-10-CM | POA: Insufficient documentation

## 2019-08-31 DIAGNOSIS — L819 Disorder of pigmentation, unspecified: Secondary | ICD-10-CM | POA: Insufficient documentation

## 2019-08-31 DIAGNOSIS — Z131 Encounter for screening for diabetes mellitus: Secondary | ICD-10-CM

## 2019-08-31 LAB — POCT URINALYSIS DIP (CLINITEK)
Bilirubin, UA: NEGATIVE
Blood, UA: NEGATIVE
Glucose, UA: NEGATIVE mg/dL
Ketones, POC UA: NEGATIVE mg/dL
Nitrite, UA: NEGATIVE
POC PROTEIN,UA: NEGATIVE
Spec Grav, UA: 1.02 (ref 1.010–1.025)
Urobilinogen, UA: 0.2 E.U./dL
pH, UA: 6 (ref 5.0–8.0)

## 2019-08-31 MED ORDER — NAPROXEN 500 MG PO TABS: 500.0000 mg | ORAL_TABLET | Freq: Two times a day (BID) | ORAL | 0 refills | Status: DC

## 2019-08-31 MED ORDER — METHOCARBAMOL 500 MG PO TABS: 500.0000 mg | ORAL_TABLET | Freq: Three times a day (TID) | ORAL | 1 refills | Status: DC | PRN

## 2019-08-31 MED FILL — METHOCARBAMOL 500 MG TABS: 500 | 20 days supply | Qty: 60 | Fill #0

## 2019-08-31 MED FILL — NAPROXEN 500 MG TABLET: 500 | 15 days supply | Qty: 30 | Fill #0

## 2019-08-31 NOTE — Progress Notes (Signed)
Assessment & Plan:  Lindsay Friedman was seen today for flank pain.  Diagnoses and all orders for this visit:  Encounter to establish care -     Cervicovaginal ancillary only -     Hemoglobin A1c -     CBC -     CMP14+EGFR -     Lipid panel  Acute bilateral low back pain without sciatica -     methocarbamol (ROBAXIN) 500 MG tablet; Take 1 tablet (500 mg total) by mouth every 8 (eight) hours as needed for muscle spasms. -     naproxen (NAPROSYN) 500 MG tablet; Take 1 tablet (500 mg total) by mouth 2 (two) times daily with a meal.  May alternate with heat and ice application for pain relief. May also alternate with acetaminophen as prescribed for back pain. Other alternatives include massage, acupuncture and water aerobics.  You must stay active and avoid a sedentary lifestyle.   Dysuria -     POCT URINALYSIS DIP (CLINITEK) -     CT RENAL STONE STUDY; Future  Lower abdominal pain -     POCT URINALYSIS DIP (CLINITEK) -     Cervicovaginal ancillary only -     CT RENAL STONE STUDY; Future  Encounter for screening for HIV -     HIV antibody (with reflex)  Hyperpigmented skin lesion -     Ambulatory referral to Dermatology    Patient has been counseled on age-appropriate routine health concerns for screening and prevention. These are reviewed and up-to-date. Referrals have been placed accordingly. Immunizations are up-to-date or declined.    Subjective:   Chief Complaint  Patient presents with  . Flank Pain    Pt is having side pain radiates to her lower back. Pt. stated she is having pain when she urinates.    HPI Lindsay Friedman 49 y.o. female presents to office today to establish care.    Endorses acute lumbar Back Pain and pelvic pain. States pain is similar to pain she experienced when she was diagnosed with pyelonephritis. She denies N/ hematuria or flank pain. Endorses burning, itching, nausea, dysuria and feeling of incomplete bladder emptying. States she has been lifting her  granddaughter and this may be contributing to her back pain.  Aggravating factors: bending, heavy lifting. She has tried OTC pain relief which has not been effective.   Dermatology Referral Endorses history of basal cell carcinoma. Has a few hyperpigmented lesions she needs to have evaluated.     Review of Systems  Constitutional: Negative for fever, malaise/fatigue and weight loss.  HENT: Negative.  Negative for nosebleeds.   Eyes: Negative.  Negative for blurred vision, double vision and photophobia.  Respiratory: Negative.  Negative for cough and shortness of breath.   Cardiovascular: Negative.  Negative for chest pain, palpitations and leg swelling.  Gastrointestinal: Negative.  Negative for heartburn, nausea and vomiting.  Musculoskeletal: Positive for back pain and myalgias.  Neurological: Negative.  Negative for dizziness, focal weakness, seizures and headaches.  Psychiatric/Behavioral: Negative.  Negative for suicidal ideas.    Past Medical History:  Diagnosis Date  . COPD (chronic obstructive pulmonary disease) ([place])   . Migraines   . Pneumonia   . Pneumothorax     Past Surgical History:  Procedure Laterality Date  . ABDOMINAL HYSTERECTOMY    . LUNG SURGERY      History reviewed. No pertinent family history.  Social History Reviewed with no changes to be made today.   Outpatient Medications Prior to Visit  Medication Sig Dispense Refill  . acetaminophen (TYLENOL) 500 MG tablet Take 2,500-3,000 mg by mouth daily as needed for moderate pain.     Marland Kitchen albuterol (VENTOLIN HFA) 108 (90 Base) MCG/ACT inhaler Inhale 2 puffs into the lungs every 6 (six) hours as needed for wheezing or shortness of breath. 18 g 1  . nicotine (NICODERM CQ - DOSED IN MG/24 HR) 7 mg/24hr patch Place 1 patch (7 mg total) onto the skin daily. 28 patch 0  . predniSONE (DELTASONE) 10 MG tablet 6,5,4,3,2,1 take each days dose in am with food (Patient not taking: Reported on 08/31/2019) 21 tablet 0  .  salmeterol (SEREVENT DISKUS) 50 MCG/DOSE diskus inhaler Inhale 1 puff into the lungs 2 (two) times daily. 60 each 6  . topiramate (TOPAMAX) 50 MG tablet Take 1 tablet (50 mg total) by mouth 2 (two) times daily. 60 tablet 1   No facility-administered medications prior to visit.    Allergies  Allergen Reactions  . Bee Venom Anaphylaxis       Objective:    BP 107/72 (BP Location: Right Arm, Patient Position: Sitting, Cuff Size: Normal)   Pulse 87   Temp 97.7 F (36.5 C) (Temporal)   Wt 105 lb (47.6 kg)   SpO2 98%   BMI 19.20 kg/m  Wt Readings from Last 3 Encounters:  08/31/19 105 lb (47.6 kg)  02/15/19 107 lb (48.5 kg)  06/01/18 110 lb (49.9 kg)    Physical Exam Vitals and nursing note reviewed.  Constitutional:      Appearance: She is well-developed.  HENT:     Head: Normocephalic and atraumatic.  Cardiovascular:     Rate and Rhythm: Normal rate and regular rhythm.     Heart sounds: Normal heart sounds. No murmur. No friction rub. No gallop.   Pulmonary:     Effort: Pulmonary effort is normal. No tachypnea or respiratory distress.     Breath sounds: Normal breath sounds. No decreased breath sounds, wheezing, rhonchi or rales.  Chest:     Chest wall: No tenderness.    Abdominal:     General: Bowel sounds are normal.     Palpations: Abdomen is soft.  Musculoskeletal:     Cervical back: Normal range of motion.     Lumbar back: Positive left straight leg raise test. Negative right straight leg raise test.  Skin:    General: Skin is warm and dry.  Neurological:     Mental Status: She is alert and oriented to person, place, and time.     Coordination: Coordination normal.  Psychiatric:        Behavior: Behavior normal. Behavior is cooperative.        Thought Content: Thought content normal.        Judgment: Judgment normal.          Patient has been counseled extensively about nutrition and exercise as well as the importance of adherence with medications and  regular follow-up. The patient was given clear instructions to go to ER or return to medical center if symptoms don't improve, worsen or new problems develop. The patient verbalized understanding.   Follow-up: Return in about 3 weeks (around 09/21/2019) for back pain f/u and PAP.   Gildardo Pounds, FNP-BC [facility] [place], [place]   09/01/2019, 4:00 PM

## 2019-09-01 ENCOUNTER — Other Ambulatory Visit: Payer: Self-pay | Admitting: Nurse Practitioner

## 2019-09-01 ENCOUNTER — Encounter: Payer: Self-pay | Admitting: Nurse Practitioner

## 2019-09-01 DIAGNOSIS — J441 Chronic obstructive pulmonary disease with (acute) exacerbation: Secondary | ICD-10-CM

## 2019-09-01 LAB — CMP14+EGFR
ALT: 9 IU/L (ref 0–32)
AST: 21 IU/L (ref 0–40)
Albumin/Globulin Ratio: 2.1 (ref 1.2–2.2)
Albumin: 4.7 g/dL (ref 3.8–4.8)
Alkaline Phosphatase: 93 IU/L (ref 39–117)
BUN/Creatinine Ratio: 12 (ref 9–23)
BUN: 10 mg/dL (ref 6–24)
Bilirubin Total: <0.2 mg/dL (ref 0.0–1.2)
CO2: 23 mmol/L (ref 20–29)
Calcium: 9.7 mg/dL (ref 8.7–10.2)
Chloride: 104 mmol/L (ref 96–106)
Creatinine, Ser: 0.83 mg/dL (ref 0.57–1.00)
GFR calc Af Amer: 96 mL/min/{1.73_m2} (ref >59)
GFR calc non Af Amer: 84 mL/min/{1.73_m2} (ref >59)
Globulin, Total: 2.2 g/dL (ref 1.5–4.5)
Glucose: 72 mg/dL (ref 65–99)
Potassium: 4.2 mmol/L (ref 3.5–5.2)
Sodium: 143 mmol/L (ref 134–144)
Total Protein: 6.9 g/dL (ref 6.0–8.5)

## 2019-09-01 LAB — CERVICOVAGINAL ANCILLARY ONLY
Bacterial Vaginitis (gardnerella): NEGATIVE
Candida Glabrata: NEGATIVE
Candida Vaginitis: POSITIVE — AB
Chlamydia: NEGATIVE
Comment: NEGATIVE
Comment: NEGATIVE
Comment: NEGATIVE
Comment: NEGATIVE
Comment: NEGATIVE
Comment: NORMAL
Neisseria Gonorrhea: NEGATIVE
Trichomonas: POSITIVE — AB

## 2019-09-01 LAB — LIPID PANEL
Chol/HDL Ratio: 4.1 ratio (ref 0.0–4.4)
Cholesterol, Total: 235 mg/dL — ABNORMAL HIGH (ref 100–199)
HDL: 58 mg/dL (ref >39)
LDL Chol Calc (NIH): 146 mg/dL — ABNORMAL HIGH (ref 0–99)
Triglycerides: 173 mg/dL — ABNORMAL HIGH (ref 0–149)
VLDL Cholesterol Cal: 31 mg/dL (ref 5–40)

## 2019-09-01 LAB — CBC
Hematocrit: 40.4 % (ref 34.0–46.6)
Hemoglobin: 13.6 g/dL (ref 11.1–15.9)
MCH: 33.5 pg — ABNORMAL HIGH (ref 26.6–33.0)
MCHC: 33.7 g/dL (ref 31.5–35.7)
MCV: 100 fL — ABNORMAL HIGH (ref 79–97)
Platelets: 282 10*3/uL (ref 150–450)
RBC: 4.06 x10E6/uL (ref 3.77–5.28)
RDW: 12.8 % (ref 11.7–15.4)
WBC: 11.2 10*3/uL — ABNORMAL HIGH (ref 3.4–10.8)

## 2019-09-01 LAB — HIV ANTIBODY (ROUTINE TESTING W REFLEX): HIV Screen 4th Generation wRfx: NONREACTIVE

## 2019-09-01 LAB — HEMOGLOBIN A1C
Est. average glucose Bld gHb Est-mCnc: 111 mg/dL
Hgb A1c MFr Bld: 5.5 % (ref 4.8–5.6)

## 2019-09-01 MED ORDER — METRONIDAZOLE 500 MG PO TABS: 500.0000 mg | ORAL_TABLET | Freq: Two times a day (BID) | ORAL | 0 refills | Status: AC

## 2019-09-01 MED ORDER — SEREVENT DISKUS 50 MCG/DOSE IN AEPB: 1.0000 | INHALATION_SPRAY | Freq: Two times a day (BID) | RESPIRATORY_TRACT | 6 refills | Status: DC

## 2019-09-01 MED ORDER — FLUCONAZOLE 150 MG PO TABS: 150.0000 mg | ORAL_TABLET | Freq: Once | ORAL | 1 refills | Status: AC

## 2019-09-02 MED FILL — metroNIDAZOLE 500 MG TABS: 500 | 7 days supply | Qty: 14 | Fill #0

## 2019-09-02 MED FILL — FLUCONAZOLE 150 MG TABLET: 150 | 1 days supply | Qty: 1 | Fill #0

## 2019-09-19 ENCOUNTER — Emergency Department
Admission: EM | Admit: 2019-09-19 | Discharge: 2019-09-19 | Disposition: A | Discharge status: Home / Self Care | Payer: Medicaid Other | Attending: Emergency Medicine | Admitting: Emergency Medicine

## 2019-09-19 ENCOUNTER — Encounter: Payer: Self-pay | Admitting: Emergency Medicine

## 2019-09-19 ENCOUNTER — Other Ambulatory Visit: Payer: Self-pay

## 2019-09-19 DIAGNOSIS — N12 Tubulo-interstitial nephritis, not specified as acute or chronic: Secondary | ICD-10-CM

## 2019-09-19 DIAGNOSIS — J449 Chronic obstructive pulmonary disease, unspecified: Secondary | ICD-10-CM | POA: Insufficient documentation

## 2019-09-19 DIAGNOSIS — F17210 Nicotine dependence, cigarettes, uncomplicated: Secondary | ICD-10-CM | POA: Insufficient documentation

## 2019-09-19 DIAGNOSIS — Z87442 Personal history of urinary calculi: Secondary | ICD-10-CM | POA: Insufficient documentation

## 2019-09-19 HISTORY — DX: Calculus of kidney: N20.0

## 2019-09-19 LAB — URINALYSIS, COMPLETE (UACMP) WITH MICROSCOPIC
Bacteria, UA: NONE SEEN
Bilirubin Urine: NEGATIVE
Glucose, UA: 100 mg/dL — AB
Hgb urine dipstick: NEGATIVE
Ketones, ur: 15 mg/dL — AB
Leukocytes,Ua: NEGATIVE
Nitrite: POSITIVE — AB
Protein, ur: NEGATIVE mg/dL
RBC / HPF: NONE SEEN RBC/hpf (ref 0–5)
Specific Gravity, Urine: <1.005 — ABNORMAL LOW (ref 1.005–1.030)
WBC, UA: NONE SEEN WBC/hpf (ref 0–5)
pH: 5.5 (ref 5.0–8.0)

## 2019-09-19 LAB — BASIC METABOLIC PANEL
Anion gap: 13 (ref 5–15)
BUN: 11 mg/dL (ref 6–20)
CO2: 21 mmol/L — ABNORMAL LOW (ref 22–32)
Calcium: 9.7 mg/dL (ref 8.9–10.3)
Chloride: 103 mmol/L (ref 98–111)
Creatinine, Ser: 0.8 mg/dL (ref 0.44–1.00)
GFR calc Af Amer: >60 mL/min (ref >60)
GFR calc non Af Amer: >60 mL/min (ref >60)
Glucose, Bld: 80 mg/dL (ref 70–99)
Potassium: 3.8 mmol/L (ref 3.5–5.1)
Sodium: 137 mmol/L (ref 135–145)

## 2019-09-19 LAB — CBC
HCT: 41.1 % (ref 36.0–46.0)
Hemoglobin: 14 g/dL (ref 12.0–15.0)
MCH: 33.9 pg (ref 26.0–34.0)
MCHC: 34.1 g/dL (ref 30.0–36.0)
MCV: 99.5 fL (ref 80.0–100.0)
Platelets: 333 10*3/uL (ref 150–400)
RBC: 4.13 MIL/uL (ref 3.87–5.11)
RDW: 13.4 % (ref 11.5–15.5)
WBC: 10.8 10*3/uL — ABNORMAL HIGH (ref 4.0–10.5)
nRBC: 0 % (ref 0.0–0.2)

## 2019-09-19 MED ORDER — CIPROFLOXACIN HCL 500 MG PO TABS: 500.0000 mg | ORAL_TABLET | Freq: Two times a day (BID) | ORAL | 0 refills | Status: AC

## 2019-09-19 MED ORDER — CEFTRIAXONE SODIUM 1 G IJ SOLR
1.0000 g | Freq: Once | INTRAMUSCULAR | Status: AC
  Administered 2019-09-19: 1 g via INTRAMUSCULAR
  Filled 2019-09-19: qty 10

## 2019-09-19 MED ORDER — CIPROFLOXACIN HCL 500 MG PO TABS
500.0000 mg | ORAL_TABLET | Freq: Once | ORAL | Status: AC
  Administered 2019-09-19: 500 mg via ORAL
  Filled 2019-09-19: qty 1

## 2019-09-19 MED ORDER — PHENAZOPYRIDINE HCL 95 MG PO TABS
95.0000 mg | ORAL_TABLET | Freq: Three times a day (TID) | ORAL | 0 refills | Status: DC | PRN
Start: 2019-09-19 — End: 2020-01-11

## 2019-09-19 NOTE — ED Provider Notes (Signed)
[facility] Emergency Department Provider Note  ____________________________________________  Time seen: Approximately 7:25 PM  I have reviewed the triage vital signs and the nursing notes.   HISTORY  Chief Complaint Back Pain and Nausea    HPI Lindsay Friedman is a 49 y.o. female who presents the emergency department concern for urinary tract infection.  Patient states that she has experienced some dysuria, pelvic pain that is now extending into her lower back, increased urination, nausea and fevers.  Patient states that she does have a history of nephrolithiasis but this feels different.  She states that that typically starts in her back, then radiates down.  Her pain symptoms for this encounter started low and has risen upwards.  Patient denies any hematuria which is typical for her kidney stones.  She states that she has had fevers that are responding to over-the-counter antipyretics.  Some nausea but no emesis.  Patient denies any other complaints at this time.         Past Medical History:  Diagnosis Date  . COPD (chronic obstructive pulmonary disease) (HCC)   . Kidney stone   . Migraines   . Pneumonia   . Pneumothorax     There are no problems to display for this patient.   Past Surgical History:  Procedure Laterality Date  . ABDOMINAL HYSTERECTOMY    . LUNG SURGERY      Prior to Admission medications   Medication Sig Start Date End Date Taking? Authorizing Provider  acetaminophen (TYLENOL) 500 MG tablet Take 2,500-3,000 mg by mouth daily as needed for moderate pain.     [provider]  albuterol (VENTOLIN HFA) 108 (90 Base) MCG/ACT inhaler Inhale 2 puffs into the lungs every 6 (six) hours as needed for wheezing or shortness of breath. 06/09/19   Anders Simmonds, PA-C  methocarbamol (ROBAXIN) 500 MG tablet Take 1 tablet (500 mg total) by mouth every 8 (eight) hours as needed for muscle spasms. 08/31/19   Claiborne Rigg, NP  naproxen  (NAPROSYN) 500 MG tablet Take 1 tablet (500 mg total) by mouth 2 (two) times daily with a meal. 08/31/19   Claiborne Rigg, NP  nicotine (NICODERM CQ - DOSED IN MG/24 HR) 7 mg/24hr patch Place 1 patch (7 mg total) onto the skin daily. 04/29/19   Claiborne Rigg, NP  salmeterol (SEREVENT DISKUS) 50 MCG/DOSE diskus inhaler Inhale 1 puff into the lungs 2 (two) times daily. 09/01/19 10/01/19  Claiborne Rigg, NP  topiramate (TOPAMAX) 50 MG tablet Take 1 tablet (50 mg total) by mouth 2 (two) times daily. 02/16/19 03/18/19  Claiborne Rigg, NP    Allergies Bee venom  No family history on file.  Social History Social History   Tobacco Use  . Smoking status: Current Every Day Smoker    Packs/day: 0.50    Types: Cigarettes  . Smokeless tobacco: Never Used  Substance Use Topics  . Alcohol use: No  . Drug use: Yes    Types: Marijuana     Review of Systems  Constitutional: No fever/chills Eyes: No visual changes. No discharge ENT: No upper respiratory complaints. Cardiovascular: no chest pain. Respiratory: no cough. No SOB. Gastrointestinal: No abdominal pain.  No nausea, no vomiting.  No diarrhea.  No constipation. Genitourinary: Positive for dysuria and polyuria. No hematuria.  Positive for right-sided pelvic and flank pain. Musculoskeletal: Negative for musculoskeletal pain. Skin: Negative for rash, abrasions, lacerations, ecchymosis. Neurological: Negative for headaches, focal weakness or numbness. 10-point  ROS otherwise negative.  ____________________________________________   PHYSICAL EXAM:  VITAL SIGNS: ED Triage Vitals [09/19/19 1531]  Enc Vitals Group     BP 130/90     Pulse Rate (!) 102     Resp 18     Temp 98.3 F (36.8 C)     Temp Source Oral     SpO2 97 %     Weight 105 lb (47.6 kg)     Height 5\' 2"  (1.575 m)     Head Circumference      Peak Flow      Pain Score 9     Pain Loc      Pain Edu?      Excl. in GC?      Constitutional: Alert and oriented.  Well appearing and in no acute distress. Eyes: Conjunctivae are normal. PERRL. EOMI. Head: Atraumatic. ENT:      Ears:       Nose: No congestion/rhinnorhea.      Mouth/Throat: Mucous membranes are moist.  Neck: No stridor.    Cardiovascular: Normal rate, regular rhythm. Normal S1 and S2.  Good peripheral circulation. Respiratory: Normal respiratory effort without tachypnea or retractions. Lungs CTAB. Good air entry to the bases with no decreased or absent breath sounds. Gastrointestinal: Bowel sounds 4 quadrants. Soft and nontender to palpation. No guarding or rigidity. No palpable masses. No distention.  Positive for right-sided CVA tenderness. Musculoskeletal: Full range of motion to all extremities. No gross deformities appreciated. Neurologic:  Normal speech and language. No gross focal neurologic deficits are appreciated.  Skin:  Skin is warm, dry and intact. No rash noted. Psychiatric: Mood and affect are normal. Speech and behavior are normal. Patient exhibits appropriate insight and judgement.   ____________________________________________   LABS (all labs ordered are listed, but only abnormal results are displayed)  Labs Reviewed  URINALYSIS, COMPLETE (UACMP) WITH MICROSCOPIC - Abnormal; Notable for the following components:      Result Value   Color, Urine AMBER (*)    Specific Gravity, Urine <1.005 (*)    Glucose, UA 100 (*)    Ketones, ur 15 (*)    Nitrite POSITIVE (*)    All other components within normal limits  CBC - Abnormal; Notable for the following components:   WBC 10.8 (*)    All other components within normal limits  BASIC METABOLIC PANEL - Abnormal; Notable for the following components:   CO2 21 (*)    All other components within normal limits   ____________________________________________  EKG   ____________________________________________  RADIOLOGY   No results  found.  ____________________________________________    PROCEDURES  Procedure(s) performed:    Procedures    Medications  cefTRIAXone (ROCEPHIN) injection 1 g (has no administration in time range)  ciprofloxacin (CIPRO) tablet 500 mg (has no administration in time range)     ____________________________________________   INITIAL IMPRESSION / ASSESSMENT AND PLAN / ED COURSE  Pertinent labs & imaging results that were available during my care of the patient were reviewed by me and considered in my medical decision making (see chart for details).  Review of the [place] CSRS was performed in accordance of the [facility] prior to dispensing any controlled drugs.           Patient's diagnosis is consistent with pyelonephritis.  Patient presented to emergency department complaining of dysuria, polyuria, flank pain, fevers, nausea.  Patient states that she began with dysuria, some pelvic pain a few days ago.  She was trying to  treat conservatively with over-the-counter medications and Azo.  Symptoms began to worsen, she began to have fevers, a sending pain to the right flank.  Patient does have a history of nephrolithiasis but states that the presentation is different.  Typically pain starts in the flank region, radiates downward as well as having hematuria and no dysuria or fevers.  Patient states that in this case she began with dysuria, pelvic pain and it is radiated upwards and now has fevers, nausea.  I discussed imaging versus no imaging with the patient in regards to nephrolithiasis and infection.  At this time patient opts for treatment she has close follow-up with her primary care.  If patient does not improve or worsen she should return to the emergency department for further evaluation.  Patient verbalizes understanding and agreement with this plan.  Patient is given Rocephin and Cipro here in the emergency department.. Patient will be discharged home with prescriptions for Cipro. Patient  is to follow up with primary care as needed or otherwise directed. Patient is given ED precautions to return to the ED for any worsening or new symptoms.     ____________________________________________  FINAL CLINICAL IMPRESSION(S) / ED DIAGNOSES  Final diagnoses:  Pyelonephritis      NEW MEDICATIONS STARTED DURING THIS VISIT:  ED Discharge Orders    None          This chart was dictated using voice recognition software/Dragon. Despite best efforts to proofread, errors can occur which can change the meaning. Any change was purely unintentional.    Darletta Moll, PA-C 09/19/19 1950    Duffy Bruce, MD 09/20/19 1244

## 2019-09-19 NOTE — ED Notes (Signed)
Pt hard of hearing at bedside, had to ask questions loudly.

## 2019-09-19 NOTE — ED Triage Notes (Addendum)
Pt arrived via POV with reports of lower back pain radiating across lower back, right side pain, increased  Urination, nausea, and fevers at home.  Pt has hx of kidney stones on right side.   Pt took AZO tabs at home, urine orange in color.

## 2019-10-01 ENCOUNTER — Ambulatory Visit: Payer: Self-pay | Attending: Nurse Practitioner | Admitting: Nurse Practitioner

## 2019-10-01 ENCOUNTER — Other Ambulatory Visit: Payer: Self-pay

## 2019-10-01 ENCOUNTER — Encounter: Payer: Self-pay | Admitting: Nurse Practitioner

## 2019-10-01 VITALS — BP 106/75 | HR 98 | Temp 97.7°F | Wt 108.0 lb

## 2019-10-01 DIAGNOSIS — M545 Low back pain, unspecified: Secondary | ICD-10-CM

## 2019-10-01 DIAGNOSIS — A599 Trichomoniasis, unspecified: Secondary | ICD-10-CM

## 2019-10-01 MED ORDER — METHOCARBAMOL 750 MG PO TABS: 750.0000 mg | ORAL_TABLET | Freq: Three times a day (TID) | ORAL | 2 refills | Status: DC | PRN

## 2019-10-01 MED ORDER — IBUPROFEN 800 MG PO TABS: 800.0000 mg | ORAL_TABLET | Freq: Three times a day (TID) | ORAL | 1 refills | Status: DC | PRN

## 2019-10-01 MED ORDER — METRONIDAZOLE 500 MG PO TABS: 500.0000 mg | ORAL_TABLET | Freq: Two times a day (BID) | ORAL | 0 refills | Status: AC

## 2019-10-01 NOTE — Progress Notes (Signed)
Assessment & Plan:  Lindsay Friedman was seen today for follow-up.  Diagnoses and all orders for this visit:  Acute bilateral low back pain without sciatica -     DG Lumbar Spine Complete; Future -     methocarbamol (ROBAXIN) 750 MG tablet; Take 1 tablet (750 mg total) by mouth every 8 (eight) hours as needed for muscle spasms. -     ibuprofen (ADVIL) 800 MG tablet; Take 1 tablet (800 mg total) by mouth every 8 (eight) hours as needed.  Trichimoniasis -     metroNIDAZOLE (FLAGYL) 500 MG tablet; Take 1 tablet (500 mg total) by mouth 2 (two) times daily for 7 days.    Patient has been counseled on age-appropriate routine health concerns for screening and prevention. These are reviewed and up-to-date. Referrals have been placed accordingly. Immunizations are up-to-date or declined.    Subjective:   Chief Complaint  Patient presents with  . Follow-up    Pt. is still having back pain.    HPI BRIUNNA Friedman 49 y.o. female presents to office today for back pain.   Se was evaluated by me on 08-31-2019 with complaints of chronic low back pain without sciatica and pelvic pain. Endorses acute lumbar Back Pain and pelvic pain. Urine was negative for blood. Wet prep positive for trichomoniasis.  Today she states her daughter misplaced her flagyl so she will need a refill.  Aggravating factors to back pain: bending, heavy lifting. She had tried OTC pain relief which had not been effective. I prescribed her robaxin and naprosyn at her last office visit.  Today she states her back pain is still present with no improvement from previous medications. There is also associated numbness burning and tingling in both hips. Will switch to ibuprofen and increase robaxin.    Review of Systems  Constitutional: Negative for fever, malaise/fatigue and weight loss.  HENT: Negative.  Negative for nosebleeds.   Eyes: Negative.  Negative for blurred vision, double vision and photophobia.  Respiratory: Negative.  Negative  for cough and shortness of breath.   Cardiovascular: Negative.  Negative for chest pain, palpitations and leg swelling.  Gastrointestinal: Negative.  Negative for heartburn, nausea and vomiting.  Genitourinary: Negative for flank pain.  Musculoskeletal: Positive for back pain. Negative for myalgias.  Neurological: Positive for tingling. Negative for dizziness, focal weakness, seizures and headaches.  Psychiatric/Behavioral: Negative.  Negative for suicidal ideas.    Past Medical History:  Diagnosis Date  . COPD (chronic obstructive pulmonary disease) (HCC)   . Kidney stone   . Migraines   . Pneumonia   . Pneumothorax     Past Surgical History:  Procedure Laterality Date  . ABDOMINAL HYSTERECTOMY    . LUNG SURGERY      History reviewed. No pertinent family history.  Social History Reviewed with no changes to be made today.   Outpatient Medications Prior to Visit  Medication Sig Dispense Refill  . acetaminophen (TYLENOL) 500 MG tablet Take 2,500-3,000 mg by mouth daily as needed for moderate pain.     Marland Kitchen albuterol (VENTOLIN HFA) 108 (90 Base) MCG/ACT inhaler Inhale 2 puffs into the lungs every 6 (six) hours as needed for wheezing or shortness of breath. 18 g 1  . methocarbamol (ROBAXIN) 500 MG tablet Take 1 tablet (500 mg total) by mouth every 8 (eight) hours as needed for muscle spasms. 60 tablet 1  . naproxen (NAPROSYN) 500 MG tablet Take 1 tablet (500 mg total) by mouth 2 (two) times daily with  a meal. 30 tablet 0  . nicotine (NICODERM CQ - DOSED IN MG/24 HR) 7 mg/24hr patch Place 1 patch (7 mg total) onto the skin daily. (Patient not taking: Reported on 10/01/2019) 28 patch 0  . phenazopyridine (PYRIDIUM) 95 MG tablet Take 1 tablet (95 mg total) by mouth 3 (three) times daily as needed for pain. (Patient not taking: Reported on 10/01/2019) 6 tablet 0  . salmeterol (SEREVENT DISKUS) 50 MCG/DOSE diskus inhaler Inhale 1 puff into the lungs 2 (two) times daily. (Patient not taking:  Reported on 10/01/2019) 60 each 6  . topiramate (TOPAMAX) 50 MG tablet Take 1 tablet (50 mg total) by mouth 2 (two) times daily. 60 tablet 1   No facility-administered medications prior to visit.    Allergies  Allergen Reactions  . Bee Venom Anaphylaxis       Objective:    BP 106/75 (BP Location: Right Arm, Patient Position: Sitting, Cuff Size: Normal)   Pulse 98   Temp 97.7 F (36.5 C) (Temporal)   Wt 108 lb (49 kg)   SpO2 97%   BMI 19.75 kg/m  Wt Readings from Last 3 Encounters:  10/05/19 105 lb (47.6 kg)  10/01/19 108 lb (49 kg)  09/19/19 105 lb (47.6 kg)    Physical Exam Vitals and nursing note reviewed.  Constitutional:      Appearance: She is well-developed.  HENT:     Head: Normocephalic and atraumatic.  Cardiovascular:     Rate and Rhythm: Normal rate and regular rhythm.     Heart sounds: Normal heart sounds. No murmur. No friction rub. No gallop.   Pulmonary:     Effort: Pulmonary effort is normal. No tachypnea or respiratory distress.     Breath sounds: Normal breath sounds. No decreased breath sounds, wheezing, rhonchi or rales.  Chest:     Chest wall: No tenderness.  Abdominal:     General: Bowel sounds are normal.     Palpations: Abdomen is soft.  Musculoskeletal:     Cervical back: Normal range of motion.     Lumbar back: Tenderness present. Negative right straight leg raise test and negative left straight leg raise test.       Back:  Skin:    General: Skin is warm and dry.  Neurological:     Mental Status: She is alert and oriented to person, place, and time.     Coordination: Coordination normal.  Psychiatric:        Behavior: Behavior normal. Behavior is cooperative.        Thought Content: Thought content normal.        Judgment: Judgment normal.          Patient has been counseled extensively about nutrition and exercise as well as the importance of adherence with medications and regular follow-up. The patient was given clear  instructions to go to ER or return to medical center if symptoms don't improve, worsen or new problems develop. The patient verbalized understanding.   Follow-up: Return in about 4 weeks (around 10/29/2019) for back pain.   Lindsay Pounds, FNP-BC [facility] [place], [place]   10/16/2019,

## 2019-10-05 ENCOUNTER — Encounter (HOSPITAL_COMMUNITY): Payer: Self-pay

## 2019-10-05 ENCOUNTER — Other Ambulatory Visit: Payer: Self-pay

## 2019-10-05 ENCOUNTER — Emergency Department (HOSPITAL_COMMUNITY)
Admission: EM | Admit: 2019-10-05 | Discharge: 2019-10-05 | Disposition: A | Discharge status: Home / Self Care | Payer: Medicaid Other | Attending: Emergency Medicine | Admitting: Emergency Medicine

## 2019-10-05 ENCOUNTER — Emergency Department (HOSPITAL_COMMUNITY): Payer: Medicaid Other

## 2019-10-05 DIAGNOSIS — F17210 Nicotine dependence, cigarettes, uncomplicated: Secondary | ICD-10-CM | POA: Insufficient documentation

## 2019-10-05 DIAGNOSIS — Z79899 Other long term (current) drug therapy: Secondary | ICD-10-CM | POA: Insufficient documentation

## 2019-10-05 DIAGNOSIS — R109 Unspecified abdominal pain: Secondary | ICD-10-CM

## 2019-10-05 DIAGNOSIS — J449 Chronic obstructive pulmonary disease, unspecified: Secondary | ICD-10-CM | POA: Insufficient documentation

## 2019-10-05 LAB — URINALYSIS, ROUTINE W REFLEX MICROSCOPIC
Bacteria, UA: NONE SEEN
Bilirubin Urine: NEGATIVE
Glucose, UA: NEGATIVE mg/dL
Hgb urine dipstick: NEGATIVE
Ketones, ur: NEGATIVE mg/dL
Nitrite: NEGATIVE
Protein, ur: NEGATIVE mg/dL
Specific Gravity, Urine: 1.011 (ref 1.005–1.030)
pH: 5 (ref 5.0–8.0)

## 2019-10-05 LAB — CBC
HCT: 39.2 % (ref 36.0–46.0)
Hemoglobin: 12.7 g/dL (ref 12.0–15.0)
MCH: 33.7 pg (ref 26.0–34.0)
MCHC: 32.4 g/dL (ref 30.0–36.0)
MCV: 104 fL — ABNORMAL HIGH (ref 80.0–100.0)
Platelets: 243 10*3/uL (ref 150–400)
RBC: 3.77 MIL/uL — ABNORMAL LOW (ref 3.87–5.11)
RDW: 12.7 % (ref 11.5–15.5)
WBC: 11.4 10*3/uL — ABNORMAL HIGH (ref 4.0–10.5)
nRBC: 0 % (ref 0.0–0.2)

## 2019-10-05 LAB — BASIC METABOLIC PANEL
Anion gap: 11 (ref 5–15)
BUN: 7 mg/dL (ref 6–20)
CO2: 25 mmol/L (ref 22–32)
Calcium: 9.1 mg/dL (ref 8.9–10.3)
Chloride: 104 mmol/L (ref 98–111)
Creatinine, Ser: 0.74 mg/dL (ref 0.44–1.00)
GFR calc Af Amer: >60 mL/min (ref >60)
GFR calc non Af Amer: >60 mL/min (ref >60)
Glucose, Bld: 95 mg/dL (ref 70–99)
Potassium: 4.1 mmol/L (ref 3.5–5.1)
Sodium: 140 mmol/L (ref 135–145)

## 2019-10-05 MED ORDER — ONDANSETRON 4 MG PO TBDP
4.0000 mg | ORAL_TABLET | Freq: Three times a day (TID) | ORAL | 0 refills | Status: DC | PRN
Start: 2019-10-05 — End: 2020-01-11

## 2019-10-05 MED ORDER — CYCLOBENZAPRINE HCL 10 MG PO TABS
10.0000 mg | ORAL_TABLET | Freq: Two times a day (BID) | ORAL | 0 refills | Status: DC | PRN
Start: 2019-10-05 — End: 2020-01-11

## 2019-10-05 MED ORDER — OXYCODONE-ACETAMINOPHEN 5-325 MG PO TABS
1.0000 | ORAL_TABLET | Freq: Once | ORAL | Status: AC
  Administered 2019-10-05: 1 via ORAL
  Filled 2019-10-05: qty 1

## 2019-10-05 MED ORDER — ONDANSETRON 4 MG PO TBDP
4.0000 mg | ORAL_TABLET | Freq: Once | ORAL | Status: AC
  Administered 2019-10-05: 4 mg via ORAL
  Filled 2019-10-05: qty 1

## 2019-10-05 NOTE — ED Triage Notes (Addendum)
Pt arrives to ED w/ c/o 9/10 flank pain. Pt endorses polyuria, nausea. Hx kidney stones

## 2019-10-05 NOTE — ED Provider Notes (Signed)
[facility] EMERGENCY DEPARTMENT Provider Note   CSN: [id number] Arrival date & time: 10/05/19  1150     History Chief Complaint  Patient presents with  . Flank Pain    Lindsay Friedman is a 49 y.o. female w PMHx nephrolithiasis, COPD, hysterectomy, presenting to the ED with complaint of right flank pain. Pain is described as a constant dull pain with sharp shooting pains. Pain radiates to right abdomen. Pain feels similar to prior kidney stones. She reports assoc nausea, increased urinary frequency and dysuria. She has been taking ibuprofen and dramamine at home for symptoms. She also reports oxycodone provided in triage gave some improvement however is wearing off.  The history is provided by the patient.       Past Medical History:  Diagnosis Date  . COPD (chronic obstructive pulmonary disease) (HCC)   . Kidney stone   . Migraines   . Pneumonia   . Pneumothorax     There are no problems to display for this patient.   Past Surgical History:  Procedure Laterality Date  . ABDOMINAL HYSTERECTOMY    . LUNG SURGERY       OB History   No obstetric history on file.     No family history on file.  Social History   Tobacco Use  . Smoking status: Current Every Day Smoker    Packs/day: 0.50    Types: Cigarettes  . Smokeless tobacco: Never Used  Substance Use Topics  . Alcohol use: No  . Drug use: Yes    Types: Marijuana    Home Medications Prior to Admission medications   Medication Sig Start Date End Date Taking? Authorizing Provider  acetaminophen (TYLENOL) 500 MG tablet Take 2,500-3,000 mg by mouth daily as needed for moderate pain.    Yes [provider]  albuterol (VENTOLIN HFA) 108 (90 Base) MCG/ACT inhaler Inhale 2 puffs into the lungs every 6 (six) hours as needed for wheezing or shortness of breath. 06/09/19  Yes Anders Simmonds, PA-C  ibuprofen (ADVIL) 800 MG tablet Take 1 tablet (800 mg total) by mouth every 8 (eight) hours  as needed. 10/01/19  Yes Claiborne Rigg, NP  methocarbamol (ROBAXIN) 750 MG tablet Take 1 tablet (750 mg total) by mouth every 8 (eight) hours as needed for muscle spasms. 10/01/19 10/31/19 Yes Claiborne Rigg, NP  metroNIDAZOLE (FLAGYL) 500 MG tablet Take 1 tablet (500 mg total) by mouth 2 (two) times daily for 7 days. 10/01/19 10/08/19 Yes Claiborne Rigg, NP  cyclobenzaprine (FLEXERIL) 10 MG tablet Take 1 tablet (10 mg total) by mouth 2 (two) times daily as needed for muscle spasms. 10/05/19   Elvina Bosch, [place] N, PA-C  fluconazole (DIFLUCAN) 150 MG tablet Take 150 mg by mouth once. 10/01/19   [provider]  nicotine (NICODERM CQ - DOSED IN MG/24 HR) 7 mg/24hr patch Place 1 patch (7 mg total) onto the skin daily. Patient not taking: Reported on 10/01/2019 04/29/19   Claiborne Rigg, NP  phenazopyridine (PYRIDIUM) 95 MG tablet Take 1 tablet (95 mg total) by mouth 3 (three) times daily as needed for pain. Patient not taking: Reported on 10/01/2019 09/19/19   Cuthriell, Delorise Royals, PA-C  salmeterol (SEREVENT DISKUS) 50 MCG/DOSE diskus inhaler Inhale 1 puff into the lungs 2 (two) times daily. Patient not taking: Reported on 10/01/2019 09/01/19 10/01/19  Claiborne Rigg, NP  topiramate (TOPAMAX) 50 MG tablet Take 1 tablet (50 mg total) by mouth 2 (two) times daily.  02/16/19 03/18/19  Claiborne Rigg, NP    Allergies    Bee venom  Review of Systems   Review of Systems  Constitutional: Negative for chills and fever.  Gastrointestinal: Positive for nausea and vomiting.  Genitourinary: Positive for dysuria, flank pain and frequency.  All other systems reviewed and are negative.   Physical Exam Updated Vital Signs BP 108/81 (BP Location: Right Arm)   Pulse 70   Temp 98.4 F (36.9 C) (Oral)   Resp 18   Ht 5\' 2"  (1.575 m)   Wt 47.6 kg   SpO2 98%   BMI 19.20 kg/m   Physical Exam Vitals and nursing note reviewed.  Constitutional:      Appearance: She is well-developed. She is not  ill-appearing.  HENT:     Head: Normocephalic and atraumatic.  Eyes:     Conjunctiva/sclera: Conjunctivae normal.  Cardiovascular:     Rate and Rhythm: Normal rate and regular rhythm.  Pulmonary:     Effort: Pulmonary effort is normal. No respiratory distress.     Breath sounds: Normal breath sounds.  Abdominal:     General: Bowel sounds are normal.     Palpations: Abdomen is soft.     Tenderness: There is abdominal tenderness (right abdomen). There is right CVA tenderness.  Skin:    General: Skin is warm.  Neurological:     Mental Status: She is alert.  Psychiatric:        Behavior: Behavior normal.     ED Results / Procedures / Treatments   Labs (all labs ordered are listed, but only abnormal results are displayed) Labs Reviewed  URINALYSIS, ROUTINE W REFLEX MICROSCOPIC - Abnormal; Notable for the following components:      Result Value   Leukocytes,Ua TRACE (*)    All other components within normal limits  CBC - Abnormal; Notable for the following components:   WBC 11.4 (*)    RBC 3.77 (*)    MCV 104.0 (*)    All other components within normal limits  BASIC METABOLIC PANEL    EKG None  Radiology CT Renal Stone Study  Result Date: 10/05/2019 CLINICAL DATA:  Flank pain. EXAM: CT ABDOMEN AND PELVIS WITHOUT CONTRAST TECHNIQUE: Multidetector CT imaging of the abdomen and pelvis was performed following the standard protocol without IV contrast. COMPARISON:  Oct 12, 2018 FINDINGS: Lower chest: There is mild emphysematous lung disease noted within the bilateral lung bases. Hepatobiliary: No focal liver abnormality is seen. No gallstones, gallbladder wall thickening, or biliary dilatation. Pancreas: Unremarkable. No pancreatic ductal dilatation or surrounding inflammatory changes. Spleen: Normal in size without focal abnormality. Adrenals/Urinary Tract: Adrenal glands are unremarkable. Kidneys are normal, without renal calculi, focal lesion, or hydronephrosis. Bladder is  unremarkable. Stomach/Bowel: Stomach is within normal limits. Appendix appears normal. No evidence of bowel wall thickening, distention, or inflammatory changes. Noninflamed diverticula are seen within the region of the proximal ascending colon. Vascular/Lymphatic: Mild aortic calcification. No enlarged abdominal or pelvic lymph nodes. Reproductive: Status post hysterectomy. No adnexal masses. Other: No abdominal wall hernia or abnormality. No abdominopelvic ascites. Musculoskeletal: No acute or significant osseous findings. IMPRESSION: 1. No evidence of acute or active process within the abdomen or pelvis. 2. Noninflamed diverticula within the region of the proximal ascending colon. 3. Mild emphysematous lung disease. 4. Aortic atherosclerosis. Aortic Atherosclerosis (ICD10-I70.0) and Emphysema (ICD10-J43.9). Electronically Signed   By: Oct 14, 2018 M.D.   On: 10/05/2019 17:30    Procedures Procedures (including critical care time)  Medications  Ordered in ED Medications  oxyCODONE-acetaminophen (PERCOCET/ROXICET) 5-325 MG per tablet 1 tablet (1 tablet Oral Given 10/05/19 1230)  ondansetron (ZOFRAN-ODT) disintegrating tablet 4 mg (4 mg Oral Given 10/05/19 1522)  oxyCODONE-acetaminophen (PERCOCET/ROXICET) 5-325 MG per tablet 1 tablet (1 tablet Oral Given 10/05/19 1522)    ED Course  I have reviewed the triage vital signs and the nursing notes.  Pertinent labs & imaging results that were available during my care of the patient were reviewed by me and considered in my medical decision making (see chart for details).  Clinical Course as of Oct 04 1825  Tue Oct 05, 2019  1701 Patient reevaluated pending CT.  She reports pain and nausea are improved.  No additional complaints at this time.   [JR]    Clinical Course User Index [JR] Jonathan Corpus, [place] N, PA-C   MDM Rules/Calculators/A&P                      Patient here with right flank pain, nausea and urinary symptoms, with history of  nephrolithiasis.  Right CVA tenderness on exam.  UA with trace leukocytes though otherwise unremarkable.  Mild leukocytosis of 11.4.  Normal renal function with creatinine 0.74.  Vital signs are normal, patient is afebrile.  Given patient's history, we will proceed with CT stone study for further evaluation.  Redose oxycodone and order Zofran.  Patient prefers p.o. medications at this time.  CT stone study is negative for stone. Appendix appears normal, no acute osseous abnormalities noted. Discussed results with patient. Possibly musculoskeletal etiology? Pt reports the symptoms have been coming and going for about 1 year.  She has some aching in her hips as well though no radiating pain down her legs, no numbness or weakness in her extremities or bowel or bladder incontinence.  No back injuries. Will prescribe Flexeril, recommend continue OTC medications for pain.  Close PCP follow-up recommended.  Patient is agreeable to plan and safe for discharge.  Discussed results, findings, treatment and follow up. Patient advised of return precautions. Patient verbalized understanding and agreed with plan.  Final Clinical Impression(s) / ED Diagnoses Final diagnoses:  Right flank pain    Rx / DC Orders ED Discharge Orders         Ordered    cyclobenzaprine (FLEXERIL) 10 MG tablet  2 times daily PRN     10/05/19 1814           Mylz Yuan, [place] N, PA-C 10/05/19 1827    Dorie Rank, MD 10/06/19 1704

## 2019-10-05 NOTE — Discharge Instructions (Addendum)
Your CT scan, blood work, and urinalysis are reassuring today.  Please continue treating your pain with -over-the-counter medications as needed. You can take zofran every 8 hours for nausea. You can take flexeril every 12 hours as needed for muscle spasm.  Please follow closely with your PCP regarding your visit today.  Return to the emergency department if you develop severely worsening pain, numbness or weakness in your legs, bowel or bladder incontinence, high fever, or uncontrollable vomiting.

## 2019-10-05 NOTE — ED Notes (Signed)
Patient verbalizes understanding of discharge instructions. Opportunity for questioning and answers were provided. Armband removed by staff, pt discharged from ED ambulatory w/ friend  

## 2019-10-16 ENCOUNTER — Encounter: Payer: Self-pay | Admitting: Nurse Practitioner

## 2019-10-29 ENCOUNTER — Other Ambulatory Visit: Payer: Self-pay

## 2019-10-29 ENCOUNTER — Ambulatory Visit: Payer: Self-pay | Attending: Nurse Practitioner | Admitting: Nurse Practitioner

## 2019-10-29 NOTE — Progress Notes (Signed)
Patient not available for telemedicine call today

## 2019-11-03 ENCOUNTER — Ambulatory Visit: Payer: Medicaid Other

## 2019-11-29 ENCOUNTER — Emergency Department (HOSPITAL_COMMUNITY)
Admission: EM | Admit: 2019-11-29 | Discharge: 2019-11-29 | Disposition: A | Discharge status: Home / Self Care | Payer: Self-pay | Attending: Emergency Medicine | Admitting: Emergency Medicine

## 2019-11-29 ENCOUNTER — Other Ambulatory Visit: Payer: Self-pay

## 2019-11-29 ENCOUNTER — Emergency Department (HOSPITAL_COMMUNITY): Payer: Self-pay

## 2019-11-29 ENCOUNTER — Encounter (HOSPITAL_COMMUNITY): Payer: Self-pay

## 2019-11-29 DIAGNOSIS — Z20822 Contact with and (suspected) exposure to covid-19: Secondary | ICD-10-CM | POA: Insufficient documentation

## 2019-11-29 DIAGNOSIS — R5383 Other fatigue: Secondary | ICD-10-CM | POA: Insufficient documentation

## 2019-11-29 DIAGNOSIS — B349 Viral infection, unspecified: Secondary | ICD-10-CM | POA: Insufficient documentation

## 2019-11-29 DIAGNOSIS — Z7951 Long term (current) use of inhaled steroids: Secondary | ICD-10-CM | POA: Insufficient documentation

## 2019-11-29 DIAGNOSIS — F17210 Nicotine dependence, cigarettes, uncomplicated: Secondary | ICD-10-CM | POA: Insufficient documentation

## 2019-11-29 DIAGNOSIS — J449 Chronic obstructive pulmonary disease, unspecified: Secondary | ICD-10-CM | POA: Insufficient documentation

## 2019-11-29 DIAGNOSIS — R0789 Other chest pain: Secondary | ICD-10-CM | POA: Insufficient documentation

## 2019-11-29 LAB — SARS CORONAVIRUS 2 BY RT PCR (HOSPITAL ORDER, PERFORMED IN ~~LOC~~ HOSPITAL LAB): SARS Coronavirus 2: NEGATIVE

## 2019-11-29 NOTE — ED Triage Notes (Signed)
Pt reports generalized body aches, fatigue, N/V, x2 weeks. Pt endorses chest tightness and SHOB that started last night. Pt denies receiving COVID vaccine.

## 2019-11-29 NOTE — ED Provider Notes (Signed)
[facility]-EMERGENCY DEPT Provider Note   CSN: [id number] Arrival date & time: 11/29/19  1545     History Chief Complaint  Patient presents with  . Shortness of Breath  . Generalized Body Aches    Lindsay Friedman is a 49 y.o. female.  HPI   Patient presents to the ED with symptoms that she feels is concerning for Covid.  Patient states her symptoms started before the last weekend.  Patient states that she has had difficulty with body aches fatigue, nausea and vomiting.  The vomiting and nausea have resolved but the patient continues to feel fatigued.  Last night she started having some trouble with feeling tightness in her chest and shortness of breath.  Patient does not have any known Covid exposures.  She has not been vaccinated.  Past Medical History:  Diagnosis Date  . COPD (chronic obstructive pulmonary disease) (HCC)   . Kidney stone   . Migraines   . Pneumonia   . Pneumothorax     There are no problems to display for this patient.   Past Surgical History:  Procedure Laterality Date  . ABDOMINAL HYSTERECTOMY    . LUNG SURGERY       OB History   No obstetric history on file.     History reviewed. No pertinent family history.  Social History   Tobacco Use  . Smoking status: Current Every Day Smoker    Packs/day: 0.50    Types: Cigarettes  . Smokeless tobacco: Never Used  Vaping Use  . Vaping Use: Never used  Substance Use Topics  . Alcohol use: No  . Drug use: Yes    Types: Marijuana    Home Medications Prior to Admission medications   Medication Sig Start Date End Date Taking? Authorizing Provider  acetaminophen (TYLENOL) 500 MG tablet Take 2,500-3,000 mg by mouth daily as needed for moderate pain.     [provider]  albuterol (VENTOLIN HFA) 108 (90 Base) MCG/ACT inhaler Inhale 2 puffs into the lungs every 6 (six) hours as needed for wheezing or shortness of breath. 06/09/19   Anders Simmonds, PA-C  cyclobenzaprine  (FLEXERIL) 10 MG tablet Take 1 tablet (10 mg total) by mouth 2 (two) times daily as needed for muscle spasms. 10/05/19   Robinson, [place] N, PA-C  fluconazole (DIFLUCAN) 150 MG tablet Take 150 mg by mouth once. 10/01/19   [provider]  ibuprofen (ADVIL) 800 MG tablet Take 1 tablet (800 mg total) by mouth every 8 (eight) hours as needed. 10/01/19   Claiborne Rigg, NP  nicotine (NICODERM CQ - DOSED IN MG/24 HR) 7 mg/24hr patch Place 1 patch (7 mg total) onto the skin daily. Patient not taking: Reported on 10/01/2019 04/29/19   Claiborne Rigg, NP  ondansetron (ZOFRAN ODT) 4 MG disintegrating tablet Take 1 tablet (4 mg total) by mouth every 8 (eight) hours as needed for nausea or vomiting. 10/05/19   Robinson, [place] N, PA-C  phenazopyridine (PYRIDIUM) 95 MG tablet Take 1 tablet (95 mg total) by mouth 3 (three) times daily as needed for pain. Patient not taking: Reported on 10/01/2019 09/19/19   Cuthriell, Delorise Royals, PA-C  salmeterol (SEREVENT DISKUS) 50 MCG/DOSE diskus inhaler Inhale 1 puff into the lungs 2 (two) times daily. Patient not taking: Reported on 10/01/2019 09/01/19 10/01/19  Claiborne Rigg, NP  topiramate (TOPAMAX) 50 MG tablet Take 1 tablet (50 mg total) by mouth 2 (two) times daily. 02/16/19 03/18/19  Claiborne Rigg,  NP    Allergies    Bee venom  Review of Systems   Review of Systems  All other systems reviewed and are negative.   Physical Exam Updated Vital Signs BP 118/87 (BP Location: Right Arm)   Pulse 88   Temp 98.2 F (36.8 C) (Oral)   Resp 10   Ht 1.575 m (5\' 2" )   Wt 49.9 kg   SpO2 99%   BMI 20.12 kg/m   Physical Exam Vitals and nursing note reviewed.  Constitutional:      General: She is not in acute distress.    Appearance: She is well-developed.  HENT:     Head: Normocephalic and atraumatic.     Right Ear: External ear normal.     Left Ear: External ear normal.  Eyes:     General: No scleral icterus.       Right eye: No discharge.         Left eye: No discharge.     Conjunctiva/sclera: Conjunctivae normal.  Neck:     Trachea: No tracheal deviation.  Cardiovascular:     Rate and Rhythm: Normal rate and regular rhythm.  Pulmonary:     Effort: Pulmonary effort is normal. No respiratory distress.     Breath sounds: Normal breath sounds. No stridor. No wheezing or rales.  Abdominal:     General: Bowel sounds are normal. There is no distension.     Palpations: Abdomen is soft.     Tenderness: There is no abdominal tenderness. There is no guarding or rebound.  Musculoskeletal:        General: No tenderness.     Cervical back: Neck supple.  Skin:    General: Skin is warm and dry.     Findings: No rash.  Neurological:     Mental Status: She is alert.     Cranial Nerves: No cranial nerve deficit (no facial droop, extraocular movements intact, no slurred speech).     Sensory: No sensory deficit.     Motor: No abnormal muscle tone or seizure activity.     Coordination: Coordination normal.     ED Results / Procedures / Treatments   Labs (all labs ordered are listed, but only abnormal results are displayed) Labs Reviewed  SARS CORONAVIRUS 2 BY RT PCR [facility] ORDER, PERFORMED IN [facility] LAB)    EKG EKG Interpretation  Date/Time:  Monday November 29 2019 15:57:20 EDT Ventricular Rate:  90 PR Interval:    QRS Duration: 80 QT Interval:  350 QTC Calculation: 429 R Axis:   88 Text Interpretation: Sinus rhythm Anteroseptal infarct, age indeterminate 76 Lead; Mason-Likar No significant change since last tracing Confirmed by 14 [id number]) on 11/29/2019 5:16:55 PM   Radiology DG Chest Portable 1 View  Result Date: 11/29/2019 CLINICAL DATA:  Body aches, fatigue, nausea and vomiting, chest tightness, shortness of breath EXAM: PORTABLE CHEST 1 VIEW COMPARISON:  02/15/2019 FINDINGS: Single frontal view of the chest demonstrates a stable cardiac silhouette. There is background emphysema without airspace disease,  effusion, or pneumothorax. No acute bony abnormalities. IMPRESSION: 1. Stable emphysema, no acute process. Electronically Signed   By: 02/17/2019 M.D.   On: 11/29/2019 17:07    Procedures Procedures (including critical care time)  Medications Ordered in ED Medications - No data to display  ED Course  I have reviewed the triage vital signs and the nursing notes.  Pertinent labs & imaging results that were available during my care of the patient were  reviewed by me and considered in my medical decision making (see chart for details).  Clinical Course as of Nov 29 1826  Mon Nov 29, 2019  1626 Sx suggest COvid 19.  Will get CXR, EKG and Covid test.  No hypoxia, vitals stable fortunately.  10 days since sx onset   [JK]  1813 Cxr negative   [JK]    Clinical Course User Index [JK] Linwood Dibbles, MD   MDM Rules/Calculators/A&P                          Patient presented to the ED for evaluation of generalized body aches fatigue and shortness of breath.  Patient symptoms were suggestive of Covid.  However her Covid test is negative.  Chest x-ray does not show pneumonia.  No concerning EKG findings.  Patient is low risk for PE, PERC negative.  I doubt PE or acute coronary syndrome.  Suspect viral illness.  Patient appears stable for discharge. Final Clinical Impression(s) / ED Diagnoses Final diagnoses:  Viral syndrome    Rx / DC Orders ED Discharge Orders    None       Linwood Dibbles, MD 11/29/19 [phone]

## 2019-11-29 NOTE — Discharge Instructions (Addendum)
Your chest x-ray did not show pneumonia.  The Covid test was negative.  Follow-up with your primary care doctor to be rechecked if your symptoms persist

## 2019-12-24 ENCOUNTER — Encounter: Payer: Self-pay | Admitting: Nurse Practitioner

## 2019-12-30 ENCOUNTER — Other Ambulatory Visit: Payer: Self-pay | Admitting: Nurse Practitioner

## 2019-12-30 ENCOUNTER — Other Ambulatory Visit: Payer: Self-pay

## 2019-12-30 ENCOUNTER — Ambulatory Visit: Payer: Medicaid Other | Attending: Nurse Practitioner

## 2019-12-30 ENCOUNTER — Other Ambulatory Visit (HOSPITAL_COMMUNITY)
Admission: RE | Admit: 2019-12-30 | Discharge: 2019-12-30 | Disposition: A | Discharge status: Home / Self Care | Payer: Medicaid Other | Source: Ambulatory Visit | Attending: Nurse Practitioner | Admitting: Nurse Practitioner

## 2019-12-30 DIAGNOSIS — R300 Dysuria: Secondary | ICD-10-CM | POA: Diagnosis not present

## 2019-12-30 DIAGNOSIS — N390 Urinary tract infection, site not specified: Secondary | ICD-10-CM

## 2019-12-30 LAB — POCT URINALYSIS DIP (CLINITEK)
Bilirubin, UA: NEGATIVE
Blood, UA: NEGATIVE
Glucose, UA: NEGATIVE mg/dL
Ketones, POC UA: NEGATIVE mg/dL
Leukocytes, UA: NEGATIVE
Nitrite, UA: NEGATIVE
POC PROTEIN,UA: NEGATIVE
Spec Grav, UA: 1.025 (ref 1.010–1.025)
Urobilinogen, UA: 0.2 E.U./dL
pH, UA: 6.5 (ref 5.0–8.0)

## 2020-01-03 LAB — CERVICOVAGINAL ANCILLARY ONLY
Bacterial Vaginitis (gardnerella): POSITIVE — AB
Candida Glabrata: POSITIVE — AB
Candida Vaginitis: POSITIVE — AB
Chlamydia: NEGATIVE
Comment: NEGATIVE
Comment: NEGATIVE
Comment: NEGATIVE
Comment: NEGATIVE
Comment: NEGATIVE
Comment: NORMAL
Neisseria Gonorrhea: NEGATIVE
Trichomonas: NEGATIVE

## 2020-01-04 ENCOUNTER — Other Ambulatory Visit: Payer: Self-pay | Admitting: Nurse Practitioner

## 2020-01-04 MED ORDER — FLUCONAZOLE 150 MG PO TABS: 150.0000 mg | ORAL_TABLET | Freq: Once | ORAL | 0 refills | Status: AC

## 2020-01-04 MED ORDER — METRONIDAZOLE 500 MG PO TABS: 500.0000 mg | ORAL_TABLET | Freq: Two times a day (BID) | ORAL | 0 refills | Status: AC

## 2020-01-06 ENCOUNTER — Other Ambulatory Visit: Payer: Self-pay | Admitting: Nurse Practitioner

## 2020-01-06 MED ORDER — FLUCONAZOLE 150 MG PO TABS: 150.0000 mg | ORAL_TABLET | Freq: Once | ORAL | 0 refills | Status: AC

## 2020-01-11 ENCOUNTER — Emergency Department: Payer: Self-pay

## 2020-01-11 ENCOUNTER — Other Ambulatory Visit: Payer: Self-pay

## 2020-01-11 ENCOUNTER — Emergency Department
Admission: EM | Admit: 2020-01-11 | Discharge: 2020-01-11 | Disposition: A | Discharge status: Home / Self Care | Payer: Self-pay | Attending: Emergency Medicine | Admitting: Emergency Medicine

## 2020-01-11 DIAGNOSIS — Z79899 Other long term (current) drug therapy: Secondary | ICD-10-CM | POA: Insufficient documentation

## 2020-01-11 DIAGNOSIS — R109 Unspecified abdominal pain: Secondary | ICD-10-CM

## 2020-01-11 DIAGNOSIS — Z20822 Contact with and (suspected) exposure to covid-19: Secondary | ICD-10-CM | POA: Insufficient documentation

## 2020-01-11 DIAGNOSIS — R112 Nausea with vomiting, unspecified: Secondary | ICD-10-CM

## 2020-01-11 DIAGNOSIS — K2900 Acute gastritis without bleeding: Secondary | ICD-10-CM

## 2020-01-11 DIAGNOSIS — J449 Chronic obstructive pulmonary disease, unspecified: Secondary | ICD-10-CM | POA: Insufficient documentation

## 2020-01-11 DIAGNOSIS — F17210 Nicotine dependence, cigarettes, uncomplicated: Secondary | ICD-10-CM | POA: Insufficient documentation

## 2020-01-11 DIAGNOSIS — E860 Dehydration: Secondary | ICD-10-CM

## 2020-01-11 LAB — HIV ANTIBODY (ROUTINE TESTING W REFLEX): HIV Screen 4th Generation wRfx: NONREACTIVE

## 2020-01-11 LAB — COMPREHENSIVE METABOLIC PANEL
ALT: 14 U/L (ref 0–44)
AST: 23 U/L (ref 15–41)
Albumin: 4 g/dL (ref 3.5–5.0)
Alkaline Phosphatase: 60 U/L (ref 38–126)
Anion gap: 9 (ref 5–15)
BUN: 12 mg/dL (ref 6–20)
CO2: 27 mmol/L (ref 22–32)
Calcium: 9.1 mg/dL (ref 8.9–10.3)
Chloride: 104 mmol/L (ref 98–111)
Creatinine, Ser: 0.71 mg/dL (ref 0.44–1.00)
GFR calc Af Amer: >60 mL/min (ref >60)
GFR calc non Af Amer: >60 mL/min (ref >60)
Glucose, Bld: 84 mg/dL (ref 70–99)
Potassium: 4.3 mmol/L (ref 3.5–5.1)
Sodium: 140 mmol/L (ref 135–145)
Total Bilirubin: 0.6 mg/dL (ref 0.3–1.2)
Total Protein: 6.9 g/dL (ref 6.5–8.1)

## 2020-01-11 LAB — URINALYSIS, COMPLETE (UACMP) WITH MICROSCOPIC
Bilirubin Urine: NEGATIVE
Glucose, UA: NEGATIVE mg/dL
Hgb urine dipstick: NEGATIVE
Ketones, ur: NEGATIVE mg/dL
Leukocytes,Ua: NEGATIVE
Nitrite: NEGATIVE
Protein, ur: NEGATIVE mg/dL
Specific Gravity, Urine: 1.008 (ref 1.005–1.030)
pH: 6 (ref 5.0–8.0)

## 2020-01-11 LAB — CHLAMYDIA/NGC RT PCR (ARMC ONLY)
Chlamydia Tr: NOT DETECTED
N gonorrhoeae: NOT DETECTED

## 2020-01-11 LAB — CBC
HCT: 38.4 % (ref 36.0–46.0)
Hemoglobin: 12.5 g/dL (ref 12.0–15.0)
MCH: 33 pg (ref 26.0–34.0)
MCHC: 32.6 g/dL (ref 30.0–36.0)
MCV: 101.3 fL — ABNORMAL HIGH (ref 80.0–100.0)
Platelets: 236 10*3/uL (ref 150–400)
RBC: 3.79 MIL/uL — ABNORMAL LOW (ref 3.87–5.11)
RDW: 13.1 % (ref 11.5–15.5)
WBC: 9.2 10*3/uL (ref 4.0–10.5)
nRBC: 0 % (ref 0.0–0.2)

## 2020-01-11 LAB — TROPONIN I (HIGH SENSITIVITY): Troponin I (High Sensitivity): <2 ng/L (ref <18)

## 2020-01-11 LAB — MAGNESIUM: Magnesium: 1.9 mg/dL (ref 1.7–2.4)

## 2020-01-11 LAB — LIPASE, BLOOD: Lipase: 24 U/L (ref 11–51)

## 2020-01-11 LAB — SARS CORONAVIRUS 2 BY RT PCR (HOSPITAL ORDER, PERFORMED IN ~~LOC~~ HOSPITAL LAB): SARS Coronavirus 2: NEGATIVE

## 2020-01-11 MED ORDER — DICYCLOMINE HCL 10 MG PO CAPS: 10.0000 mg | ORAL_CAPSULE | Freq: Four times a day (QID) | ORAL | 0 refills | Status: DC

## 2020-01-11 MED ORDER — IOHEXOL 300 MG/ML  SOLN
100.0000 mL | Freq: Once | INTRAMUSCULAR | Status: AC | PRN
  Administered 2020-01-11: 100 mL via INTRAVENOUS

## 2020-01-11 MED ORDER — ONDANSETRON HCL 4 MG PO TABS: 4.0000 mg | ORAL_TABLET | Freq: Three times a day (TID) | ORAL | 0 refills | Status: DC | PRN

## 2020-01-11 MED ORDER — FENTANYL CITRATE (PF) 100 MCG/2ML IJ SOLN
50.0000 ug | Freq: Once | INTRAMUSCULAR | Status: AC
  Administered 2020-01-11: 50 ug via INTRAVENOUS
  Filled 2020-01-11: qty 2

## 2020-01-11 MED ORDER — SODIUM CHLORIDE 0.9 % IV BOLUS
1000.0000 mL | Freq: Once | INTRAVENOUS | Status: AC
  Administered 2020-01-11: 1000 mL via INTRAVENOUS

## 2020-01-11 MED ORDER — KETOROLAC TROMETHAMINE 30 MG/ML IJ SOLN
30.0000 mg | Freq: Once | INTRAMUSCULAR | Status: AC
  Administered 2020-01-11: 30 mg via INTRAVENOUS
  Filled 2020-01-11: qty 1

## 2020-01-11 MED ORDER — LACTATED RINGERS IV BOLUS
1000.0000 mL | Freq: Once | INTRAVENOUS | Status: AC
  Administered 2020-01-11: 1000 mL via INTRAVENOUS

## 2020-01-11 MED ORDER — ONDANSETRON HCL 4 MG/2ML IJ SOLN
4.0000 mg | Freq: Once | INTRAMUSCULAR | Status: AC
  Administered 2020-01-11: 4 mg via INTRAVENOUS
  Filled 2020-01-11: qty 2

## 2020-01-11 NOTE — ED Notes (Signed)
Pt states she feels dizzy, pt is hypotensive and appears pale. EKG performed at this time

## 2020-01-11 NOTE — ED Notes (Signed)
Pt in with c/o R abdominal/flank/back pain. Sharp and throbbing per pt. Somewhat tender. States has had UTIs and kidney stones in the past. States BMs have been baseline. States has had changes with urination. Pt requesting pain meds currently. Provider Katrinka Blazing notified. Skin dry. Pt resting calmly in bed. States nearly finished with antibiotic her primary doctor prescribed recently.

## 2020-01-11 NOTE — ED Provider Notes (Signed)
[facility] Emergency Department Provider Note  ____________________________________________   First MD Initiated Contact with Patient 01/11/20 1459     (approximate)  I have reviewed the triage vital signs and the nursing notes.   HISTORY  Chief Complaint Abdominal Pain and Possible UTI   HPI Lindsay Friedman is a 49 y.o. female with a past medical history of COPD, ongoing tobacco abuse, migraines, previous spontaneous pneumothorax, kidney stone, and recurrent urinary tract infections who presents for assessment approximate 1 week of generalized abdominal pain worse in the right upper quadrant rating to her right lower back associated with burning with urination as well as nausea and nonbloody nonbilious vomiting.  Patient states she was seen by primary care physician who prescribed her Flagyl for BV but this has not helped.  She states she has not been treated for urinary tract infection in the last several weeks.  Also endorses some substernal chest pain earlier today lasted a few minutes and has resolved.  It did not radiate.  No prior similar episodes.  Patient denies any diarrhea, blood in her stool, blood in her urine, vaginal discharge, rash, current chest pain, shortness of breath, back pain, extremity pain, or other acute complaints.  No clearly bleeding agreed factors.  No personal episodes.         Past Medical History:  Diagnosis Date  . COPD (chronic obstructive pulmonary disease) (HCC)   . Kidney stone   . Migraines   . Pneumonia   . Pneumothorax     There are no problems to display for this patient.   Past Surgical History:  Procedure Laterality Date  . ABDOMINAL HYSTERECTOMY    . LUNG SURGERY      Prior to Admission medications   Medication Sig Start Date End Date Taking? Authorizing Provider  acetaminophen (TYLENOL) 500 MG tablet Take 2,500-3,000 mg by mouth daily as needed for moderate pain.    Yes [provider]    metroNIDAZOLE (FLAGYL) 500 MG tablet Take 1 tablet (500 mg total) by mouth 2 (two) times daily for 7 days. 01/04/20 01/11/20 Yes Claiborne Rigg, NP  dicyclomine (BENTYL) 10 MG capsule Take 1 capsule (10 mg total) by mouth 4 (four) times daily for 14 days. 01/11/20 01/25/20  Gilles Chiquito, MD  fluconazole (DIFLUCAN) 150 MG tablet Take 150 mg by mouth daily. Patient not taking: Reported on 01/11/2020 01/04/20   [provider]  ibuprofen (ADVIL) 800 MG tablet Take 1 tablet (800 mg total) by mouth every 8 (eight) hours as needed. 10/01/19   Claiborne Rigg, NP  ondansetron (ZOFRAN) 4 MG tablet Take 1 tablet (4 mg total) by mouth every 8 (eight) hours as needed for up to 10 doses for nausea or vomiting. 01/11/20   Gilles Chiquito, MD    Allergies Bee venom  No family history on file.  Social History Social History   Tobacco Use  . Smoking status: Current Every Day Smoker    Packs/day: 0.50    Types: Cigarettes  . Smokeless tobacco: Never Used  Vaping Use  . Vaping Use: Never used  Substance Use Topics  . Alcohol use: No  . Drug use: Yes    Types: Marijuana    Review of Systems  Review of Systems  Constitutional: Positive for chills and fever.  HENT: Negative for sore throat.   Eyes: Negative for pain.  Respiratory: Negative for cough and stridor.   Cardiovascular: Negative for chest pain.  Gastrointestinal: Positive  for abdominal pain and nausea. Negative for vomiting.  Genitourinary: Positive for flank pain and urgency.  Skin: Negative for rash.  Neurological: Negative for seizures, loss of consciousness and headaches.  Psychiatric/Behavioral: Negative for suicidal ideas.  All other systems reviewed and are negative.     ____________________________________________   PHYSICAL EXAM:  VITAL SIGNS: ED Triage Vitals [01/11/20 1157]  Enc Vitals Group     BP (!) 81/57     Pulse Rate 94     Resp 18     Temp 97.7 F (36.5 C)     Temp src      SpO2 98 %      Weight 130 lb (59 kg)     Height 5\' 3"  (1.6 m)     Head Circumference      Peak Flow      Pain Score 8     Pain Loc      Pain Edu?      Excl. in GC?    Vitals:   01/11/20 1630 01/11/20 1645  BP: 106/80   Pulse: 74 68  Resp:    Temp:    SpO2: 96% 97%   Physical Exam Vitals and nursing note reviewed.  Constitutional:      General: She is not in acute distress.    Appearance: She is well-developed.  HENT:     Head: Normocephalic and atraumatic.     Right Ear: External ear normal.     Left Ear: External ear normal.     Nose: Nose normal.     Mouth/Throat:     Mouth: Mucous membranes are dry.  Eyes:     Conjunctiva/sclera: Conjunctivae normal.  Cardiovascular:     Rate and Rhythm: Normal rate and regular rhythm.     Heart sounds: No murmur heard.   Pulmonary:     Effort: Pulmonary effort is normal. No respiratory distress.     Breath sounds: Normal breath sounds.  Abdominal:     General: There is no distension.     Palpations: Abdomen is soft.     Tenderness: There is generalized abdominal tenderness and tenderness in the right upper quadrant. There is right CVA tenderness. There is no left CVA tenderness.  Musculoskeletal:     Cervical back: Neck supple.     Right lower leg: No edema.     Left lower leg: No edema.  Skin:    General: Skin is warm and dry.     Capillary Refill: Capillary refill takes 2 to 3 seconds.  Neurological:     Mental Status: She is alert and oriented to person, place, and time.  Psychiatric:        Mood and Affect: Mood normal.      ____________________________________________   LABS (all labs ordered are listed, but only abnormal results are displayed)  Labs Reviewed  CBC - Abnormal; Notable for the following components:      Result Value   RBC 3.79 (*)    MCV 101.3 (*)    All other components within normal limits  URINALYSIS, COMPLETE (UACMP) WITH MICROSCOPIC - Abnormal; Notable for the following components:   Color, Urine  YELLOW (*)    APPearance CLOUDY (*)    Bacteria, UA FEW (*)    All other components within normal limits  URINE CULTURE  SARS CORONAVIRUS 2 BY RT PCR (HOSPITAL ORDER, PERFORMED IN [facility])  CHLAMYDIA/NGC RT PCR ([facility] ONLY)  LIPASE, BLOOD  COMPREHENSIVE METABOLIC PANEL  MAGNESIUM  HIV ANTIBODY (ROUTINE TESTING W REFLEX)  TROPONIN I (HIGH SENSITIVITY)   ____________________________________________  EKG  Normal sinus rhythm with a ventricular rate of 92, normal axis, unremarkable intervals, no evidence of acute ischemia or other significant underlying arrhythmia. ____________________________________________  RADIOLOGY  ED MD interpretation: Unremarkable chest x-ray and CT abdomen pelvis.  Official radiology report(s): DG Chest 2 View  Result Date: 01/11/2020 CLINICAL DATA:  Chest pain EXAM: CHEST - 2 VIEW COMPARISON:  11/29/2019 FINDINGS: Emphysematous disease with apical bulla and mild scarring. No acute opacity or pleural effusion. Normal heart size. No pneumothorax. IMPRESSION: No active cardiopulmonary disease. Emphysematous disease. Electronically Signed   By: Jasmine PangKim  Fujinaga M.D.   On: 01/11/2020 16:09   CT ABDOMEN PELVIS W CONTRAST  Result Date: 01/11/2020 CLINICAL DATA:  Abdominal pain with nausea and vomiting for the past week. EXAM: CT ABDOMEN AND PELVIS WITH CONTRAST TECHNIQUE: Multidetector CT imaging of the abdomen and pelvis was performed using the standard protocol following bolus administration of intravenous contrast. CONTRAST:  100mL OMNIPAQUE IOHEXOL 300 MG/ML  SOLN COMPARISON:  CT abdomen pelvis dated Oct 05, 2019. FINDINGS: Lower chest: No acute abnormality. Mild centrilobular emphysema at the lung bases again noted. Hepatobiliary: No focal liver abnormality is seen. Unchanged tiny cyst in segment 4a. No gallstones, gallbladder wall thickening, or biliary dilatation. Pancreas: Unremarkable. No pancreatic ductal dilatation or surrounding  inflammatory changes. Spleen: Normal in size without focal abnormality. Adrenals/Urinary Tract: The adrenal glands are unremarkable. Tiny low-density lesions in both kidneys remain too small to characterize but are unchanged. No renal calculi, solid renal lesion, or hydronephrosis. The bladder is partially decompressed. Stomach/Bowel: Stomach is within normal limits. Appendix appears normal. No evidence of bowel wall thickening, distention, or inflammatory changes. Vascular/Lymphatic: Aortic atherosclerosis. No enlarged abdominal or pelvic lymph nodes. Reproductive: Status post hysterectomy. No adnexal masses. Other: No abdominal wall hernia or abnormality. No abdominopelvic ascites. No pneumoperitoneum. Musculoskeletal: No acute or significant osseous findings. IMPRESSION: 1. No acute intra-abdominal process. 2. Aortic Atherosclerosis (ICD10-I70.0). 3.  Emphysema (ICD10-J43.9). Electronically Signed   By: Obie DredgeWilliam T Derry M.D.   On: 01/11/2020 16:09    ____________________________________________   PROCEDURES  Procedure(s) performed (including Critical Care):  Procedures   ____________________________________________   INITIAL IMPRESSION / ASSESSMENT AND PLAN / ED COURSE        Patient presents with above to history exam for assessment of an episode of chest pain that occurred earlier today before noon as well as approximately 1 week of generalized abdominal pain worse in the right upper quadrant and rating to the right back associate with dysuria.  Patient is borderline hypotensive with otherwise stable vital signs on room air on arrival.  Exam as above.  Overall it is unclear the precise etiology for patient's symptoms although viral gastritis with dehydration secondary to vomiting is within the differential.  With regard to patient's chest pain low suspicion for ACS given pain was greater than 6 hours ago and patient has reassuring EKG and undetectable troponin.  Patient has no shortness of  breath, tachypnea, hypoxia to suggest PE.  Chest x-ray shows evidence of pneumonia, thorax, pulmonary edema, effusion, or other acute intrathoracic process.  Overall patient's labs are reassuring she has no evidence of acute symptomatic anemia or significant metabolic derangement.  Patient CT abdomen pelvis shows no evidence of pancreatitis, appendicitis, pyelonephritis, diverticulitis, abscess, or other acute intra-abdominal process.  There is no evidence of cholestasis on CMP.  UA is not consistent with cystitis.  GC studies sent.  Covid  PCR sent.  Patient was hydrated and given below noted antiemetics.  On reassessment she had normal vital signs aside she felt a little bit better.  Given reassuring work-up with improvement in blood pressure after hydration I do believe patient is safe for discharge with plan for close outpatient PCP follow-up.  Discharged in stable condition.  Strict return precautions advised and discussed.  Isolation precautions discussed pending results of PCR for Covid.  Medications  sodium chloride 0.9 % bolus 1,000 mL (0 mLs Intravenous Stopped 01/11/20 1547)  lactated ringers bolus 1,000 mL (1,000 mLs Intravenous New Bag/Given 01/11/20 1554)  ondansetron (ZOFRAN) injection 4 mg (4 mg Intravenous Given 01/11/20 1552)  iohexol (OMNIPAQUE) 300 MG/ML solution 100 mL (100 mLs Intravenous Contrast Given 01/11/20 1525)  fentaNYL (SUBLIMAZE) injection 50 mcg (50 mcg Intravenous Given 01/11/20 1612)  ketorolac (TORADOL) 30 MG/ML injection 30 mg (30 mg Intravenous Given 01/11/20 1647)     ____________________________________________   FINAL CLINICAL IMPRESSION(S) / ED DIAGNOSES  Final diagnoses:  Dehydration  Abdominal pain, unspecified abdominal location  Nausea and vomiting, intractability of vomiting not specified, unspecified vomiting type  Acute gastritis without hemorrhage, unspecified gastritis type     ED Discharge Orders         Ordered    ondansetron (ZOFRAN) 4 MG  tablet  Every 8 hours PRN        01/11/20 1650    dicyclomine (BENTYL) 10 MG capsule  4 times daily        01/11/20 1650           Note:  This document was prepared using Dragon voice recognition software and may include unintentional dictation errors.   Gilles Chiquito, MD 01/11/20 [phone]

## 2020-01-11 NOTE — ED Notes (Signed)
Pt called her ride. Waiting for more of LR bolus to running; currently about 600cc left. Will d/c soon.

## 2020-01-11 NOTE — ED Notes (Signed)
Pt given ginger ale.

## 2020-01-11 NOTE — ED Triage Notes (Addendum)
Pt comes via POV from home with c/o abdominal pain, possible UTI and kidney infection. Pt states she has been taken medication with no relief.  Pt states burning with urination and right upper quad pain that radiates around to back.  Pt states fever, chills and aches. Pt states decreased oral intake. Pt states vomiting.  Pt is hypotensive at 81/57 rechecked twice

## 2020-01-11 NOTE — ED Notes (Addendum)
Imaging staff at bedside. Urine sample sent to lab.

## 2020-01-11 NOTE — ED Notes (Signed)
Pt remains off unit. This RN to assess, collect swab, give meds, and collect 2nd Trop once pt back to room.

## 2020-01-13 LAB — URINE CULTURE: Culture: 50000 — AB

## 2020-01-17 ENCOUNTER — Other Ambulatory Visit: Payer: Self-pay | Admitting: Nurse Practitioner

## 2020-01-17 MED ORDER — PREDNISONE 20 MG PO TABS: 20.0000 mg | ORAL_TABLET | Freq: Every day | ORAL | 0 refills | Status: AC

## 2020-01-17 MED ORDER — PREDNISONE 20 MG PO TABS: 20.0000 mg | ORAL_TABLET | Freq: Every day | ORAL | 0 refills | Status: DC

## 2020-01-17 MED FILL — predniSONE 20 MG TABS: 20 | 7 days supply | Qty: 7 | Fill #0

## 2020-03-02 ENCOUNTER — Other Ambulatory Visit: Payer: Self-pay | Admitting: Nurse Practitioner

## 2020-03-02 MED ORDER — ALBUTEROL SULFATE HFA 108 (90 BASE) MCG/ACT IN AERS: 2.0000 | INHALATION_SPRAY | Freq: Four times a day (QID) | RESPIRATORY_TRACT | 1 refills | Status: DC | PRN

## 2020-03-02 MED ORDER — GUAIFENESIN-DM 100-10 MG/5ML PO SYRP: 10.0000 mL | ORAL_SOLUTION | ORAL | 0 refills | Status: DC | PRN

## 2020-04-19 ENCOUNTER — Encounter (HOSPITAL_COMMUNITY): Payer: Self-pay | Admitting: Student

## 2020-04-19 ENCOUNTER — Emergency Department (HOSPITAL_COMMUNITY): Payer: Self-pay

## 2020-04-19 ENCOUNTER — Other Ambulatory Visit: Payer: Self-pay

## 2020-04-19 ENCOUNTER — Emergency Department (HOSPITAL_COMMUNITY)
Admission: EM | Admit: 2020-04-19 | Discharge: 2020-04-20 | Disposition: A | Discharge status: Home / Self Care | Payer: Self-pay | Attending: Emergency Medicine | Admitting: Emergency Medicine

## 2020-04-19 DIAGNOSIS — R112 Nausea with vomiting, unspecified: Secondary | ICD-10-CM | POA: Insufficient documentation

## 2020-04-19 DIAGNOSIS — R1030 Lower abdominal pain, unspecified: Secondary | ICD-10-CM | POA: Insufficient documentation

## 2020-04-19 DIAGNOSIS — N200 Calculus of kidney: Secondary | ICD-10-CM | POA: Insufficient documentation

## 2020-04-19 DIAGNOSIS — R630 Anorexia: Secondary | ICD-10-CM | POA: Insufficient documentation

## 2020-04-19 DIAGNOSIS — R319 Hematuria, unspecified: Secondary | ICD-10-CM | POA: Insufficient documentation

## 2020-04-19 DIAGNOSIS — F17210 Nicotine dependence, cigarettes, uncomplicated: Secondary | ICD-10-CM | POA: Insufficient documentation

## 2020-04-19 DIAGNOSIS — R109 Unspecified abdominal pain: Secondary | ICD-10-CM

## 2020-04-19 DIAGNOSIS — R300 Dysuria: Secondary | ICD-10-CM | POA: Insufficient documentation

## 2020-04-19 DIAGNOSIS — R509 Fever, unspecified: Secondary | ICD-10-CM | POA: Insufficient documentation

## 2020-04-19 DIAGNOSIS — J449 Chronic obstructive pulmonary disease, unspecified: Secondary | ICD-10-CM | POA: Insufficient documentation

## 2020-04-19 LAB — CBC
HCT: 41.8 % (ref 36.0–46.0)
Hemoglobin: 14.1 g/dL (ref 12.0–15.0)
MCH: 33.5 pg (ref 26.0–34.0)
MCHC: 33.7 g/dL (ref 30.0–36.0)
MCV: 99.3 fL (ref 80.0–100.0)
Platelets: 250 10*3/uL (ref 150–400)
RBC: 4.21 MIL/uL (ref 3.87–5.11)
RDW: 12.2 % (ref 11.5–15.5)
WBC: 8.6 10*3/uL (ref 4.0–10.5)
nRBC: 0 % (ref 0.0–0.2)

## 2020-04-19 LAB — URINALYSIS, ROUTINE W REFLEX MICROSCOPIC
Bilirubin Urine: NEGATIVE
Glucose, UA: NEGATIVE mg/dL
Ketones, ur: NEGATIVE mg/dL
Leukocytes,Ua: NEGATIVE
Nitrite: NEGATIVE
Protein, ur: NEGATIVE mg/dL
Specific Gravity, Urine: 1.005 (ref 1.005–1.030)
pH: 6 (ref 5.0–8.0)

## 2020-04-19 LAB — BASIC METABOLIC PANEL
Anion gap: 11 (ref 5–15)
BUN: 11 mg/dL (ref 6–20)
CO2: 24 mmol/L (ref 22–32)
Calcium: 10.2 mg/dL (ref 8.9–10.3)
Chloride: 104 mmol/L (ref 98–111)
Creatinine, Ser: 0.67 mg/dL (ref 0.44–1.00)
GFR, Estimated: >60 mL/min (ref >60)
Glucose, Bld: 97 mg/dL (ref 70–99)
Potassium: 4.4 mmol/L (ref 3.5–5.1)
Sodium: 139 mmol/L (ref 135–145)

## 2020-04-19 LAB — I-STAT BETA HCG BLOOD, ED (MC, WL, AP ONLY): I-stat hCG, quantitative: <5 m[IU]/mL (ref <5)

## 2020-04-19 MED ORDER — PHENAZOPYRIDINE HCL 200 MG PO TABS: 200.0000 mg | ORAL_TABLET | Freq: Three times a day (TID) | ORAL | 0 refills | Status: DC

## 2020-04-19 MED ORDER — MORPHINE SULFATE (PF) 4 MG/ML IV SOLN
4.0000 mg | Freq: Once | INTRAVENOUS | Status: AC
  Administered 2020-04-19: 4 mg via INTRAVENOUS
  Filled 2020-04-19: qty 1

## 2020-04-19 MED ORDER — HYDROMORPHONE HCL 1 MG/ML IJ SOLN
1.0000 mg | Freq: Once | INTRAMUSCULAR | Status: AC
  Administered 2020-04-19: 1 mg via INTRAVENOUS
  Filled 2020-04-19: qty 1

## 2020-04-19 MED ORDER — LACTATED RINGERS IV BOLUS
1000.0000 mL | Freq: Once | INTRAVENOUS | Status: AC
  Administered 2020-04-19: 1000 mL via INTRAVENOUS

## 2020-04-19 MED ORDER — ONDANSETRON HCL 4 MG/2ML IJ SOLN
4.0000 mg | Freq: Once | INTRAMUSCULAR | Status: AC
  Administered 2020-04-19: 4 mg via INTRAVENOUS
  Filled 2020-04-19: qty 2

## 2020-04-19 MED ORDER — HYDROCODONE-ACETAMINOPHEN 5-325 MG PO TABS
1.0000 | ORAL_TABLET | Freq: Four times a day (QID) | ORAL | 0 refills | Status: DC | PRN
Start: 2020-04-19 — End: 2021-09-07

## 2020-04-19 MED ORDER — PROMETHAZINE HCL 25 MG PO TABS: 25.0000 mg | ORAL_TABLET | Freq: Four times a day (QID) | ORAL | 0 refills | Status: DC | PRN

## 2020-04-19 NOTE — Discharge Instructions (Addendum)
Normal CAT scan today and no signs of urine infection.  This may be a problem that needs further evaluation by a specialist.  You will need to call the above numbers and also case management will call you tomorrow to see if they can help you with this process.

## 2020-04-19 NOTE — ED Provider Notes (Signed)
[facility]-EMERGENCY DEPT Provider Note   CSN: [id number] Arrival date & time: 04/19/20  1630     History Chief Complaint  Patient presents with  . Abdominal Pain  . Flank Pain    Lindsay Friedman is a 49 y.o. female.  The history is provided by the patient.  Abdominal Pain Pain location:  R flank, L flank and suprapubic Pain quality: aching, cramping and gnawing   Pain radiates to:  Does not radiate Pain severity:  Moderate Onset quality:  Gradual Duration:  5 days Timing:  Constant Progression:  Worsening Chronicity:  Recurrent Context comment:  Started 4-5 days ago with dysuria and right flank and RUQ pain but now moved to the LUQ and left flank as well Relieved by:  Nothing Worsened by:  Urination and deep breathing Ineffective treatments:  NSAIDs Associated symptoms: anorexia, chills, dysuria, fever, hematuria, nausea and vomiting   Associated symptoms: no cough, no diarrhea and no shortness of breath   Risk factors comment:  Hx of recurrent UTI, COPD, kidney stone Flank Pain Associated symptoms include abdominal pain. Pertinent negatives include no shortness of breath.       Past Medical History:  Diagnosis Date  . COPD (chronic obstructive pulmonary disease) (HCC)   . Kidney stone   . Migraines   . Pneumonia   . Pneumothorax     There are no problems to display for this patient.   Past Surgical History:  Procedure Laterality Date  . ABDOMINAL HYSTERECTOMY    . LUNG SURGERY       OB History   No obstetric history on file.     History reviewed. No pertinent family history.  Social History   Tobacco Use  . Smoking status: Current Every Day Smoker    Packs/day: 0.50    Types: Cigarettes  . Smokeless tobacco: Never Used  Vaping Use  . Vaping Use: Never used  Substance Use Topics  . Alcohol use: No  . Drug use: Yes    Types: Marijuana    Home Medications Prior to Admission medications   Medication Sig Start Date  End Date Taking? Authorizing Provider  acetaminophen (TYLENOL) 500 MG tablet Take 2,500-3,000 mg by mouth daily as needed for moderate pain.     [provider]  albuterol (VENTOLIN HFA) 108 (90 Base) MCG/ACT inhaler Inhale 2 puffs into the lungs every 6 (six) hours as needed for wheezing or shortness of breath. 03/02/20   Claiborne RiggFleming, Zelda W, NP  dicyclomine (BENTYL) 10 MG capsule Take 1 capsule (10 mg total) by mouth 4 (four) times daily for 14 days. 01/11/20 01/25/20  Gilles ChiquitoSmith, Zachary P, MD  fluconazole (DIFLUCAN) 150 MG tablet Take 150 mg by mouth daily. Patient not taking: Reported on 01/11/2020 01/04/20   [provider]  guaiFENesin-dextromethorphan (ROBITUSSIN DM) 100-10 MG/5ML syrup Take 10 mLs by mouth every 4 (four) hours as needed for cough. 03/02/20   Claiborne RiggFleming, Zelda W, NP  ibuprofen (ADVIL) 800 MG tablet Take 1 tablet (800 mg total) by mouth every 8 (eight) hours as needed. 10/01/19   Claiborne RiggFleming, Zelda W, NP  ondansetron (ZOFRAN) 4 MG tablet Take 1 tablet (4 mg total) by mouth every 8 (eight) hours as needed for up to 10 doses for nausea or vomiting. 01/11/20   Gilles ChiquitoSmith, Zachary P, MD    Allergies    Bee venom  Review of Systems   Review of Systems  Constitutional: Positive for chills and fever.  Respiratory: Negative for cough and  shortness of breath.   Gastrointestinal: Positive for abdominal pain, anorexia, nausea and vomiting. Negative for diarrhea.  Genitourinary: Positive for dysuria, flank pain and hematuria.  All other systems reviewed and are negative.   Physical Exam Updated Vital Signs BP 129/85 (BP Location: Right Arm)   Pulse 61   Temp 97.8 F (36.6 C) (Oral)   Resp 16   Ht 5\' 2"  (1.575 m)   Wt 49.9 kg   SpO2 99%   BMI 20.12 kg/m   Physical Exam Vitals and nursing note reviewed.  Constitutional:      General: She is not in acute distress.    Appearance: She is well-developed and normal weight.  HENT:     Head: Normocephalic and atraumatic.  Eyes:       Pupils: Pupils are equal, round, and reactive to light.  Cardiovascular:     Rate and Rhythm: Normal rate and regular rhythm.     Heart sounds: Normal heart sounds. No murmur heard.  No friction rub.  Pulmonary:     Effort: Pulmonary effort is normal.     Breath sounds: Normal breath sounds. No wheezing or rales.  Abdominal:     General: Bowel sounds are normal. There is no distension.     Palpations: Abdomen is soft.     Tenderness: There is abdominal tenderness in the suprapubic area. There is right CVA tenderness and left CVA tenderness. There is no guarding or rebound. Negative signs include Murphy's sign.  Musculoskeletal:        General: No tenderness. Normal range of motion.     Comments: No edema  Skin:    General: Skin is warm and dry.     Findings: No rash.  Neurological:     Mental Status: She is alert and oriented to person, place, and time.     Cranial Nerves: No cranial nerve deficit.  Psychiatric:        Behavior: Behavior normal.     ED Results / Procedures / Treatments   Labs (all labs ordered are listed, but only abnormal results are displayed) Labs Reviewed  URINALYSIS, ROUTINE W REFLEX MICROSCOPIC - Abnormal; Notable for the following components:      Result Value   Color, Urine STRAW (*)    Hgb urine dipstick SMALL (*)    Bacteria, UA RARE (*)    All other components within normal limits  CBC  BASIC METABOLIC PANEL  I-STAT BETA HCG BLOOD, ED ([facility], [facility], [facility] ONLY)    EKG None  Radiology CT Renal Stone Study  Result Date: 04/19/2020 CLINICAL DATA:  Right flank pain radiating into abdomen, left flank pain with inspiration EXAM: CT ABDOMEN AND PELVIS WITHOUT CONTRAST TECHNIQUE: Multidetector CT imaging of the abdomen and pelvis was performed following the standard protocol without IV contrast. COMPARISON:  01/11/2020 FINDINGS: Lower chest: Stable bibasilar emphysema. No acute pleural or parenchymal lung disease. Hepatobiliary: No focal liver  abnormality is seen. No gallstones, gallbladder wall thickening, or biliary dilatation. Pancreas: Unremarkable. No pancreatic ductal dilatation or surrounding inflammatory changes. Spleen: Normal in size without focal abnormality. Adrenals/Urinary Tract: No urinary tract calculi or obstructive uropathy within either kidney. Adrenals and bladder are unremarkable. Stomach/Bowel: No bowel obstruction or ileus. Normal appendix right lower quadrant. No bowel wall thickening or inflammatory change. Vascular/Lymphatic: Aortic atherosclerosis. No enlarged abdominal or pelvic lymph nodes. Reproductive: Status post hysterectomy. No adnexal masses. Other: No free fluid or free gas. No abdominal wall hernia. Musculoskeletal: No acute or destructive bony lesions.  Reconstructed images demonstrate no additional findings. IMPRESSION: 1. No urinary tract calculi or obstructive uropathy. 2. No acute intra-abdominal or intrapelvic process. 3. Aortic Atherosclerosis (ICD10-I70.0) and Emphysema (ICD10-J43.9). Electronically Signed   By: Sharlet Salina M.D.   On: 04/19/2020 21:22    Procedures Procedures (including critical care time)  Medications Ordered in ED Medications  lactated ringers bolus 1,000 mL (1,000 mLs Intravenous New Bag/Given 04/19/20 2053)  ondansetron (ZOFRAN) injection 4 mg (4 mg Intravenous Given 04/19/20 2051)  morphine 4 MG/ML injection 4 mg (4 mg Intravenous Given 04/19/20 2052)    ED Course  I have reviewed the triage vital signs and the nursing notes.  Pertinent labs & imaging results that were available during my care of the patient were reviewed by me and considered in my medical decision making (see chart for details).    MDM Rules/Calculators/A&P                         Patient is a 49 year old female presenting with 4 to 5 days of suprapubic pain initially just right flank pain but now also involving the left flank as well.  Patient has felt feverish and chilled as well as nausea vomiting.   She does have a history of recurrent UTIs but denies any recent antibiotic treatment.  She has noticed some dysuria as well as some mild blood in her urine.  She is status post hysterectomy and low suspicion for ovarian torsion and impossible for her to be pregnant.  She has had similar episodes in the past where etiology was unknown.  Could be interstitial cystitis versus adhesions.  Also concern for pyelonephritis or renal colic.  UA without significant evidence of UTI.  However blood is present.  We will do CT to evaluate for renal stones or perinephric stranding.  Patient given IV fluids pain and nausea medication.  Low suspicion for PE at this time and patient is low risk [place] and low suspicion at this time for PE.  Patient is not wheezing and low suspicion for COPD exacerbation.     CT was negative for acute issues.  No evidence of UTI on UA.  Discussed findings with the patient.  Patient has been having recurrent symptoms of this and suspect a more chronic issue underlying.  Possible interstitial cystitis.  Discussed patient will need to have follow-up with more outpatient testing.  Patient does not have insurance.  We will have case management follow-up with the patient but she was also given referral to women's clinic and urology.  Lower suspicion that patient symptoms are GI in nature.  MDM Number of Diagnoses or Management Options   Amount and/or Complexity of Data Reviewed Clinical lab tests: ordered and reviewed Tests in the radiology section of CPT: ordered and reviewed Decide to obtain previous medical records or to obtain history from someone other than the patient: yes Review and summarize past medical records: yes Independent visualization of images, tracings, or specimens: yes  Risk of Complications, Morbidity, and/or Mortality Presenting problems: moderate Diagnostic procedures: low Management options: low  Patient Progress Patient progress: stable     Final Clinical  Impression(s) / ED Diagnoses Final diagnoses:  Dysuria  Abdominal pain, unspecified abdominal location    Rx / DC Orders ED Discharge Orders         Ordered    HYDROcodone-acetaminophen (NORCO/VICODIN) 5-325 MG tablet  Every 6 hours PRN        04/19/20 2315  phenazopyridine (PYRIDIUM) 200 MG tablet  3 times daily        04/19/20 2315    promethazine (PHENERGAN) 25 MG tablet  Every 6 hours PRN        04/19/20 2315           Gwyneth Sprout, MD 04/19/20 2318

## 2020-04-19 NOTE — ED Triage Notes (Signed)
Patient BIB POV c/o right flank pain going around into abdomen 8/10.  Patient now also having pain on the left rib area that hurts when she takes a deep breath in.  Patient has a hx of kidney stones.

## 2020-06-08 ENCOUNTER — Encounter: Payer: Self-pay | Admitting: Nurse Practitioner

## 2020-06-12 ENCOUNTER — Other Ambulatory Visit: Payer: Self-pay | Admitting: Nurse Practitioner

## 2020-06-12 MED ORDER — BENZONATATE 200 MG PO CAPS: 200.0000 mg | ORAL_CAPSULE | Freq: Three times a day (TID) | ORAL | 0 refills | Status: AC | PRN

## 2020-06-12 MED ORDER — GUAIFENESIN-DM 100-10 MG/5ML PO SYRP: 10.0000 mL | ORAL_SOLUTION | ORAL | 0 refills | Status: DC | PRN

## 2020-06-12 MED ORDER — ONDANSETRON 4 MG PO TBDP: 4.0000 mg | ORAL_TABLET | Freq: Three times a day (TID) | ORAL | 0 refills | Status: DC | PRN

## 2020-06-13 ENCOUNTER — Emergency Department (HOSPITAL_COMMUNITY)
Admission: EM | Admit: 2020-06-13 | Discharge: 2020-06-14 | Disposition: A | Discharge status: Home / Self Care | Payer: HRSA Program | Attending: Emergency Medicine | Admitting: Emergency Medicine

## 2020-06-13 ENCOUNTER — Other Ambulatory Visit: Payer: Self-pay

## 2020-06-13 ENCOUNTER — Encounter (HOSPITAL_COMMUNITY): Payer: Self-pay | Admitting: Emergency Medicine

## 2020-06-13 ENCOUNTER — Emergency Department (HOSPITAL_COMMUNITY): Payer: HRSA Program

## 2020-06-13 DIAGNOSIS — J449 Chronic obstructive pulmonary disease, unspecified: Secondary | ICD-10-CM | POA: Insufficient documentation

## 2020-06-13 DIAGNOSIS — U071 COVID-19: Secondary | ICD-10-CM | POA: Diagnosis not present

## 2020-06-13 DIAGNOSIS — R0602 Shortness of breath: Secondary | ICD-10-CM | POA: Diagnosis present

## 2020-06-13 DIAGNOSIS — F17210 Nicotine dependence, cigarettes, uncomplicated: Secondary | ICD-10-CM | POA: Insufficient documentation

## 2020-06-13 LAB — COMPREHENSIVE METABOLIC PANEL
ALT: 12 U/L (ref 0–44)
AST: 23 U/L (ref 15–41)
Albumin: 4.4 g/dL (ref 3.5–5.0)
Alkaline Phosphatase: 62 U/L (ref 38–126)
Anion gap: 11 (ref 5–15)
BUN: 8 mg/dL (ref 6–20)
CO2: 26 mmol/L (ref 22–32)
Calcium: 9.8 mg/dL (ref 8.9–10.3)
Chloride: 100 mmol/L (ref 98–111)
Creatinine, Ser: 0.69 mg/dL (ref 0.44–1.00)
GFR, Estimated: >60 mL/min (ref >60)
Glucose, Bld: 92 mg/dL (ref 70–99)
Potassium: 4.1 mmol/L (ref 3.5–5.1)
Sodium: 137 mmol/L (ref 135–145)
Total Bilirubin: 0.5 mg/dL (ref 0.3–1.2)
Total Protein: 7.2 g/dL (ref 6.5–8.1)

## 2020-06-13 LAB — I-STAT BETA HCG BLOOD, ED (MC, WL, AP ONLY): I-stat hCG, quantitative: <5 m[IU]/mL (ref <5)

## 2020-06-13 LAB — CBC WITH DIFFERENTIAL/PLATELET
Abs Immature Granulocytes: 0 10*3/uL (ref 0.00–0.07)
Basophils Absolute: 0 10*3/uL (ref 0.0–0.1)
Basophils Relative: 0 %
Eosinophils Absolute: 0 10*3/uL (ref 0.0–0.5)
Eosinophils Relative: 1 %
HCT: 43.6 % (ref 36.0–46.0)
Hemoglobin: 14.5 g/dL (ref 12.0–15.0)
Immature Granulocytes: 0 %
Lymphocytes Relative: 46 %
Lymphs Abs: 2.4 10*3/uL (ref 0.7–4.0)
MCH: 33 pg (ref 26.0–34.0)
MCHC: 33.3 g/dL (ref 30.0–36.0)
MCV: 99.3 fL (ref 80.0–100.0)
Monocytes Absolute: 0.6 10*3/uL (ref 0.1–1.0)
Monocytes Relative: 11 %
Neutro Abs: 2.2 10*3/uL (ref 1.7–7.7)
Neutrophils Relative %: 42 %
Platelets: 201 10*3/uL (ref 150–400)
RBC: 4.39 MIL/uL (ref 3.87–5.11)
RDW: 12.7 % (ref 11.5–15.5)
WBC: 5.2 10*3/uL (ref 4.0–10.5)
nRBC: 0 % (ref 0.0–0.2)

## 2020-06-13 LAB — LACTIC ACID, PLASMA: Lactic Acid, Venous: 1.4 mmol/L (ref 0.5–1.9)

## 2020-06-13 LAB — SARS CORONAVIRUS 2 BY RT PCR (HOSPITAL ORDER, PERFORMED IN ~~LOC~~ HOSPITAL LAB): SARS Coronavirus 2: POSITIVE — AB

## 2020-06-13 NOTE — ED Notes (Signed)
Patient moved to appropriate waiting area 

## 2020-06-13 NOTE — ED Triage Notes (Signed)
Pt states she started feeling SOB, having headaches, nausea, bodyaches, fever, lost her taste and smell 2 days ago. Pt has a hx of pneumothorax and is afraid of something happening like this again due to how SOB she feels.

## 2020-06-14 NOTE — Discharge Instructions (Signed)
Drink plenty of fluids and get plenty of rest.  Take Tylenol 1000 mg rotated with ibuprofen 600 mg every 4 hours as needed for pain or fever.  Isolate at home for the next 5 days.  Return to the emergency department if you develop severe chest pain, significantly worsening breathing, or other new and concerning symptoms.

## 2020-06-14 NOTE — ED Provider Notes (Signed)
[facility] EMERGENCY DEPARTMENT Provider Note   CSN: [id number] Arrival date & time: 06/13/20  1650     History Chief Complaint  Patient presents with  . Shortness of Breath    Lindsay Friedman is a 50 y.o. female.  Patient is a 50 year old female with past medical history of COPD, migraines, prior pneumothorax 20 years ago secondary to blebs.  Patient presents today for evaluation of shortness of breath, headache, fever, and cough.  This has been worsening over the past several days.  Her breathing became worse this evening and presents for evaluation.  Patient has received her COVID vaccine, but does not recall for sure when.  The history is provided by the patient.  Shortness of Breath Severity:  Moderate Onset quality:  Sudden Timing:  Constant Progression:  Worsening Chronicity:  New Context: URI   Relieved by:  Nothing Worsened by:  Nothing      Past Medical History:  Diagnosis Date  . COPD (chronic obstructive pulmonary disease) (HCC)   . Kidney stone   . Migraines   . Pneumonia   . Pneumothorax     There are no problems to display for this patient.   Past Surgical History:  Procedure Laterality Date  . ABDOMINAL HYSTERECTOMY    . LUNG SURGERY       OB History   No obstetric history on file.     History reviewed. No pertinent family history.  Social History   Tobacco Use  . Smoking status: Current Every Day Smoker    Packs/day: 0.50    Types: Cigarettes  . Smokeless tobacco: Never Used  Vaping Use  . Vaping Use: Never used  Substance Use Topics  . Alcohol use: No  . Drug use: Yes    Types: Marijuana    Home Medications Prior to Admission medications   Medication Sig Start Date End Date Taking? Authorizing Provider  albuterol (VENTOLIN HFA) 108 (90 Base) MCG/ACT inhaler Inhale 2 puffs into the lungs every 6 (six) hours as needed for wheezing or shortness of breath. Patient not taking: Reported on 04/19/2020 03/02/20    Claiborne Rigg, NP  benzonatate (TESSALON) 200 MG capsule Take 1 capsule (200 mg total) by mouth 3 (three) times daily as needed for up to 10 days for cough. 06/12/20 06/22/20  Claiborne Rigg, NP  dicyclomine (BENTYL) 10 MG capsule Take 1 capsule (10 mg total) by mouth 4 (four) times daily for 14 days. Patient not taking: Reported on 04/19/2020 01/11/20 04/19/20  Gilles Chiquito, MD  guaiFENesin-dextromethorphan [facility] DM) 100-10 MG/5ML syrup Take 10 mLs by mouth every 4 (four) hours as needed for cough. 06/12/20   Claiborne Rigg, NP  HYDROcodone-acetaminophen (NORCO/VICODIN) 5-325 MG tablet Take 1 tablet by mouth every 6 (six) hours as needed for severe pain. 04/19/20   Gwyneth Sprout, MD  ibuprofen (ADVIL) 800 MG tablet Take 1 tablet (800 mg total) by mouth every 8 (eight) hours as needed. Patient not taking: Reported on 04/19/2020 10/01/19   Claiborne Rigg, NP  ondansetron (ZOFRAN ODT) 4 MG disintegrating tablet Take 1 tablet (4 mg total) by mouth every 8 (eight) hours as needed for nausea or vomiting. 06/12/20   Claiborne Rigg, NP  phenazopyridine (PYRIDIUM) 200 MG tablet Take 1 tablet (200 mg total) by mouth 3 (three) times daily. 04/19/20   Gwyneth Sprout, MD  promethazine (PHENERGAN) 25 MG tablet Take 1 tablet (25 mg total) by mouth every 6 (six) hours as needed  for nausea or vomiting. 04/19/20   Gwyneth Sprout, MD    Allergies    Bee venom  Review of Systems   Review of Systems  Respiratory: Positive for shortness of breath.   All other systems reviewed and are negative.   Physical Exam Updated Vital Signs BP 120/70 (BP Location: Left Arm)   Pulse 97   Temp 98.4 F (36.9 C) (Oral)   Resp 15   SpO2 99%   Physical Exam Vitals and nursing note reviewed.  Constitutional:      General: She is not in acute distress.    Appearance: She is well-developed and well-nourished. She is not diaphoretic.  HENT:     Head: Normocephalic and atraumatic.  Cardiovascular:      Rate and Rhythm: Normal rate and regular rhythm.     Heart sounds: No murmur heard. No friction rub. No gallop.   Pulmonary:     Effort: Pulmonary effort is normal. No respiratory distress.     Breath sounds: Normal breath sounds. No wheezing.  Abdominal:     General: Bowel sounds are normal. There is no distension.     Palpations: Abdomen is soft.     Tenderness: There is no abdominal tenderness.  Musculoskeletal:        General: Normal range of motion.     Cervical back: Normal range of motion and neck supple.     Right lower leg: No tenderness. No edema.     Left lower leg: No tenderness. No edema.  Skin:    General: Skin is warm and dry.  Neurological:     Mental Status: She is alert and oriented to person, place, and time.     ED Results / Procedures / Treatments   Labs (all labs ordered are listed, but only abnormal results are displayed) Labs Reviewed  SARS CORONAVIRUS 2 BY RT PCR (HOSPITAL ORDER, PERFORMED IN [facility]) - Abnormal; Notable for the following components:      Result Value   SARS Coronavirus 2 POSITIVE (*)    All other components within normal limits  LACTIC ACID, PLASMA  COMPREHENSIVE METABOLIC PANEL  CBC WITH DIFFERENTIAL/PLATELET  URINALYSIS, ROUTINE W REFLEX MICROSCOPIC  I-STAT BETA HCG BLOOD, ED ([facility], [facility], [facility] ONLY)    EKG EKG Interpretation  Date/Time:  Tuesday June 13 2020 17:32:19 EST Ventricular Rate:  78 PR Interval:  138 QRS Duration: 76 QT Interval:  354 QTC Calculation: 403 R Axis:   85 Text Interpretation: Normal sinus rhythm Normal ECG Confirmed by Geoffery Lyons ([id number]) on 06/14/2020 4:02:59 AM   Radiology DG Chest 2 View  Result Date: 06/13/2020 CLINICAL DATA:  Short of breath EXAM: CHEST - 2 VIEW COMPARISON:  01/11/2020, CT 02/15/2019 FINDINGS: Hyperinflation with emphysematous disease. Apical scarring. No consolidation or effusion. Normal heart size. No pneumothorax IMPRESSION: No active cardiopulmonary  disease. Hyperinflation with emphysematous disease. Electronically Signed   By: Jasmine Pang M.D.   On: 06/13/2020 17:51    Procedures Procedures   Medications Ordered in ED Medications - No data to display  ED Course  I have reviewed the triage vital signs and the nursing notes.  Pertinent labs & imaging results that were available during my care of the patient were reviewed by me and considered in my medical decision making (see chart for details).    MDM Rules/Calculators/A&P  Patient presenting here with complaints of URI symptoms and shortness of breath.  Patient's Covid test is positive, but vital signs are stable  with no hypoxia.  Oxygen saturations have been in the upper 90s to 100%.  Laboratory studies are reassuring and chest x-ray is clear.  Patient to be discharged with Tylenol/Motrin, home isolation, and return as needed.  Final Clinical Impression(s) / ED Diagnoses Final diagnoses:  None    Rx / DC Orders ED Discharge Orders    None       Geoffery Lyons, MD 06/14/20 [phone]

## 2020-06-15 ENCOUNTER — Encounter: Payer: Medicaid Other | Admitting: Obstetrics and Gynecology

## 2020-06-19 ENCOUNTER — Other Ambulatory Visit: Payer: Self-pay | Admitting: Nurse Practitioner

## 2020-06-20 NOTE — Telephone Encounter (Signed)
Requested medications are due for refill today.  unknown  Requested medications are on the active medications list.  no  Last refill. Was givena simiar medication 06/12/2020  Future visit scheduled.   no  Notes to clinic.  Medication requested is not on med list.

## 2020-07-17 ENCOUNTER — Emergency Department: Payer: Medicaid Other

## 2020-07-17 ENCOUNTER — Other Ambulatory Visit: Payer: Self-pay

## 2020-07-17 ENCOUNTER — Emergency Department
Admission: EM | Admit: 2020-07-17 | Discharge: 2020-07-17 | Disposition: A | Discharge status: Home / Self Care | Payer: Medicaid Other | Attending: Emergency Medicine | Admitting: Emergency Medicine

## 2020-07-17 DIAGNOSIS — R0789 Other chest pain: Secondary | ICD-10-CM | POA: Insufficient documentation

## 2020-07-17 DIAGNOSIS — F17210 Nicotine dependence, cigarettes, uncomplicated: Secondary | ICD-10-CM | POA: Insufficient documentation

## 2020-07-17 DIAGNOSIS — Z8616 Personal history of COVID-19: Secondary | ICD-10-CM | POA: Insufficient documentation

## 2020-07-17 DIAGNOSIS — J449 Chronic obstructive pulmonary disease, unspecified: Secondary | ICD-10-CM | POA: Insufficient documentation

## 2020-07-17 LAB — BASIC METABOLIC PANEL
Anion gap: 8 (ref 5–15)
BUN: 10 mg/dL (ref 6–20)
CO2: 25 mmol/L (ref 22–32)
Calcium: 9.5 mg/dL (ref 8.9–10.3)
Chloride: 105 mmol/L (ref 98–111)
Creatinine, Ser: 0.67 mg/dL (ref 0.44–1.00)
GFR, Estimated: >60 mL/min (ref >60)
Glucose, Bld: 98 mg/dL (ref 70–99)
Potassium: 4 mmol/L (ref 3.5–5.1)
Sodium: 138 mmol/L (ref 135–145)

## 2020-07-17 LAB — CBC
HCT: 39.1 % (ref 36.0–46.0)
Hemoglobin: 13.1 g/dL (ref 12.0–15.0)
MCH: 32.8 pg (ref 26.0–34.0)
MCHC: 33.5 g/dL (ref 30.0–36.0)
MCV: 98 fL (ref 80.0–100.0)
Platelets: 279 10*3/uL (ref 150–400)
RBC: 3.99 MIL/uL (ref 3.87–5.11)
RDW: 13.1 % (ref 11.5–15.5)
WBC: 7.7 10*3/uL (ref 4.0–10.5)
nRBC: 0 % (ref 0.0–0.2)

## 2020-07-17 LAB — TROPONIN I (HIGH SENSITIVITY): Troponin I (High Sensitivity): <2 ng/L (ref <18)

## 2020-07-17 MED ORDER — PREDNISONE 50 MG PO TABS: 50.0000 mg | ORAL_TABLET | Freq: Every day | ORAL | 0 refills | Status: DC

## 2020-07-17 MED ORDER — NAPROXEN 500 MG PO TABS: 500.0000 mg | ORAL_TABLET | Freq: Two times a day (BID) | ORAL | 2 refills | Status: DC

## 2020-07-17 NOTE — ED Notes (Signed)
Pt states pain that started 3 days ago to center of chest that radiates to the back

## 2020-07-17 NOTE — ED Triage Notes (Signed)
Pt c/o chest pain with SOb and nausea over the weekend, pt is in NAD, pt is ambulatory to triage with a steady gait

## 2020-07-17 NOTE — ED Provider Notes (Signed)
[facility] Emergency Department Provider Note   ____________________________________________    I have reviewed the triage vital signs and the nursing notes.   HISTORY  Chief Complaint Chest discomfort   HPI Lindsay Friedman is a 50 y.o. female with a history of COPD who presents with complaints of chest discomfort for the last 2 to 3 weeks.  Patient reports she had COVID-19 about 2 to 3 weeks ago and since then she has had discomfort in her central chest.  She reports her breathing is at baseline for me.  No calf pain or swelling.  No hemoptysis, cough has improved.  No fevers or chills.  She does have a history of a pneumothorax and pneumonia in the past.  Past Medical History:  Diagnosis Date  . COPD (chronic obstructive pulmonary disease) (HCC)   . Kidney stone   . Migraines   . Pneumonia   . Pneumothorax     There are no problems to display for this patient.   Past Surgical History:  Procedure Laterality Date  . ABDOMINAL HYSTERECTOMY    . LUNG SURGERY      Prior to Admission medications   Medication Sig Start Date End Date Taking? Authorizing Provider  naproxen (NAPROSYN) 500 MG tablet Take 1 tablet (500 mg total) by mouth 2 (two) times daily with a meal. 07/17/20  Yes Jene Every, MD  predniSONE (DELTASONE) 50 MG tablet Take 1 tablet (50 mg total) by mouth daily with breakfast. 07/17/20  Yes Jene Every, MD  albuterol (VENTOLIN HFA) 108 (90 Base) MCG/ACT inhaler Inhale 2 puffs into the lungs every 6 (six) hours as needed for wheezing or shortness of breath. Patient not taking: Reported on 04/19/2020 03/02/20   Claiborne Rigg, NP  [employer] TUSSIN DM 20-200 MG/10ML liquid TAKE 10 MLS BY MOUTH EVERY 4 (FOUR) HOURS AS NEEDED FOR COUGH. 06/21/20   Hoy Register, MD  dicyclomine (BENTYL) 10 MG capsule Take 1 capsule (10 mg total) by mouth 4 (four) times daily for 14 days. Patient not taking: Reported on 04/19/2020 01/11/20 04/19/20  Gilles Chiquito, MD  HYDROcodone-acetaminophen (NORCO/VICODIN) 5-325 MG tablet Take 1 tablet by mouth every 6 (six) hours as needed for severe pain. 04/19/20   Gwyneth Sprout, MD  ibuprofen (ADVIL) 800 MG tablet Take 1 tablet (800 mg total) by mouth every 8 (eight) hours as needed. Patient not taking: Reported on 04/19/2020 10/01/19   Claiborne Rigg, NP  ondansetron (ZOFRAN ODT) 4 MG disintegrating tablet Take 1 tablet (4 mg total) by mouth every 8 (eight) hours as needed for nausea or vomiting. 06/12/20   Claiborne Rigg, NP  phenazopyridine (PYRIDIUM) 200 MG tablet Take 1 tablet (200 mg total) by mouth 3 (three) times daily. 04/19/20   Gwyneth Sprout, MD  promethazine (PHENERGAN) 25 MG tablet Take 1 tablet (25 mg total) by mouth every 6 (six) hours as needed for nausea or vomiting. 04/19/20   Gwyneth Sprout, MD     Allergies Bee venom  No family history on file.  Social History Social History   Tobacco Use  . Smoking status: Current Every Day Smoker    Packs/day: 0.50    Types: Cigarettes  . Smokeless tobacco: Never Used  Vaping Use  . Vaping Use: Never used  Substance Use Topics  . Alcohol use: No  . Drug use: Yes    Types: Marijuana    Review of Systems  Constitutional: No fever/chills Eyes: No visual changes.  ENT:  No sore throat. Cardiovascular: As above Respiratory: As above Gastrointestinal: No abdominal pain.  No nausea, no vomiting.   Genitourinary: Negative for dysuria. Musculoskeletal: Negative for calf pain or swelling Skin: Negative for rash. Neurological: Negative for headaches or weakness   ____________________________________________   PHYSICAL EXAM:  VITAL SIGNS: ED Triage Vitals  Enc Vitals Group     BP 07/17/20 1401 (!) 127/93     Pulse Rate 07/17/20 1401 79     Resp 07/17/20 1401 17     Temp 07/17/20 1401 97.9 F (36.6 C)     Temp Source 07/17/20 1401 Oral     SpO2 07/17/20 1401 98 %     Weight 07/17/20 1402 49.9 kg (110 lb)     Height 07/17/20  1402 1.575 m (5\' 2" )     Head Circumference --      Peak Flow --      Pain Score 07/17/20 1401 8     Pain Loc --      Pain Edu? --      Excl. in GC? --     Constitutional: Alert and oriented.   Nose: No congestion/rhinnorhea. Mouth/Throat: Mucous membranes are moist.   Neck:  Painless ROM Cardiovascular: Normal rate, regular rhythm. Grossly normal heart sounds.  Good peripheral circulation. Respiratory: Normal respiratory effort.  No retractions. Lungs CTAB. Gastrointestinal: Soft and nontender. No distention.  No CVA tenderness.  Musculoskeletal: No lower extremity tenderness nor edema.  Warm and well perfused Neurologic:  Normal speech and language. No gross focal neurologic deficits are appreciated.  Skin:  Skin is warm, dry and intact. No rash noted. Psychiatric: Mood and affect are normal. Speech and behavior are normal.  ____________________________________________   LABS (all labs ordered are listed, but only abnormal results are displayed)  Labs Reviewed  BASIC METABOLIC PANEL  CBC  TROPONIN I (HIGH SENSITIVITY)  TROPONIN I (HIGH SENSITIVITY)   ____________________________________________  EKG  ED ECG REPORT I, 07/19/20, the attending physician, personally viewed and interpreted this ECG.  Date: 07/17/2020  Rhythm: normal sinus rhythm QRS Axis: normal Intervals: normal ST/T Wave abnormalities: normal Narrative Interpretation: no evidence of acute ischemia  ____________________________________________  RADIOLOGY  Chest x-ray viewed by me, no infiltrate or effusion ____________________________________________   PROCEDURES  Procedure(s) performed: No  Procedures   Critical Care performed: No ____________________________________________   INITIAL IMPRESSION / ASSESSMENT AND PLAN / ED COURSE  Pertinent labs & imaging results that were available during my care of the patient were reviewed by me and considered in my medical decision making  (see chart for details).  Patient presents with chest discomfort as noted above since having COVID-19.  Vital signs today are reassuring, exam is unremarkable.  EKG is normal, high sensitive troponin is undetectable.  Lab work is generally reassuring.  Symptoms not consistent with PE nor ACS nor myocarditis.  Suspect post COVID chest discomfort which seems to be common, I will refer her to pulmonology for further evaluation given her history of COPD as well.    ____________________________________________   FINAL CLINICAL IMPRESSION(S) / ED DIAGNOSES  Final diagnoses:  Atypical chest pain        Note:  This document was prepared using Dragon voice recognition software and may include unintentional dictation errors.   07/19/2020, MD 07/17/20 [phone]

## 2020-08-04 ENCOUNTER — Emergency Department (HOSPITAL_COMMUNITY): Payer: Medicaid Other

## 2020-08-04 ENCOUNTER — Other Ambulatory Visit: Payer: Self-pay

## 2020-08-04 ENCOUNTER — Encounter (HOSPITAL_COMMUNITY): Payer: Self-pay | Admitting: Emergency Medicine

## 2020-08-04 ENCOUNTER — Emergency Department (HOSPITAL_COMMUNITY)
Admission: EM | Admit: 2020-08-04 | Discharge: 2020-08-04 | Disposition: A | Discharge status: Home / Self Care | Payer: Medicaid Other | Attending: Emergency Medicine | Admitting: Emergency Medicine

## 2020-08-04 DIAGNOSIS — J069 Acute upper respiratory infection, unspecified: Secondary | ICD-10-CM

## 2020-08-04 DIAGNOSIS — H6123 Impacted cerumen, bilateral: Secondary | ICD-10-CM | POA: Insufficient documentation

## 2020-08-04 DIAGNOSIS — J449 Chronic obstructive pulmonary disease, unspecified: Secondary | ICD-10-CM | POA: Insufficient documentation

## 2020-08-04 DIAGNOSIS — F17210 Nicotine dependence, cigarettes, uncomplicated: Secondary | ICD-10-CM | POA: Insufficient documentation

## 2020-08-04 DIAGNOSIS — R0789 Other chest pain: Secondary | ICD-10-CM

## 2020-08-04 LAB — CBC
HCT: 40.3 % (ref 36.0–46.0)
Hemoglobin: 13.4 g/dL (ref 12.0–15.0)
MCH: 33.4 pg (ref 26.0–34.0)
MCHC: 33.3 g/dL (ref 30.0–36.0)
MCV: 100.5 fL — ABNORMAL HIGH (ref 80.0–100.0)
Platelets: 242 10*3/uL (ref 150–400)
RBC: 4.01 MIL/uL (ref 3.87–5.11)
RDW: 12.6 % (ref 11.5–15.5)
WBC: 8.5 10*3/uL (ref 4.0–10.5)
nRBC: 0 % (ref 0.0–0.2)

## 2020-08-04 LAB — BASIC METABOLIC PANEL
Anion gap: 12 (ref 5–15)
BUN: 6 mg/dL (ref 6–20)
CO2: 23 mmol/L (ref 22–32)
Calcium: 9.6 mg/dL (ref 8.9–10.3)
Chloride: 106 mmol/L (ref 98–111)
Creatinine, Ser: 0.64 mg/dL (ref 0.44–1.00)
GFR, Estimated: >60 mL/min (ref >60)
Glucose, Bld: 116 mg/dL — ABNORMAL HIGH (ref 70–99)
Potassium: 3.7 mmol/L (ref 3.5–5.1)
Sodium: 141 mmol/L (ref 135–145)

## 2020-08-04 LAB — I-STAT BETA HCG BLOOD, ED (MC, WL, AP ONLY): I-stat hCG, quantitative: <5 m[IU]/mL (ref <5)

## 2020-08-04 LAB — TROPONIN I (HIGH SENSITIVITY)
Troponin I (High Sensitivity): <2 ng/L (ref <18)
Troponin I (High Sensitivity): 2 ng/L (ref <18)

## 2020-08-04 MED ORDER — CYCLOBENZAPRINE HCL 10 MG PO TABS
10.0000 mg | ORAL_TABLET | Freq: Once | ORAL | Status: AC
  Administered 2020-08-04: 10 mg via ORAL
  Filled 2020-08-04: qty 1

## 2020-08-04 NOTE — ED Provider Notes (Signed)
[facility]-EMERGENCY DEPT Provider Note   CSN: [id number] Arrival date & time: 08/04/20  1733     History No chief complaint on file.   Lindsay Friedman is a 50 y.o. female.  HPI   Patient with significant medical history of COPD, migraines, pneumonia presents to the emergency department with chief complaint of left-sided chest pain.  She endorses chest pain started approximately around 3 AM this morning, describes it as a sharp sensation which does not radiate, worsen with arm movement as well as walking, she has shortness of breath which she has had for last couple weeks, has not worsened and becomes hot. Shedenies dizziness, lightheadedness, nausea or vomiting.  Patient has no significant cardiac history, no history of PEs or DVTs, currently not on hormone therapy.  Patient endorses that she recently got over Covid as well as influenza, she has been coughing  frequently, the cough is productive, with associated nasal congestion, she denies abdominal pain, nausea or vomiting, is tolerating p.o. without any difficulty.  Patient denies alleviating factors.  Patient denies fevers, chills, abdominal pain, nausea, vomiting, diarrhea, worsening pedal edema.  Past Medical History:  Diagnosis Date  . COPD (chronic obstructive pulmonary disease) (HCC)   . Kidney stone   . Migraines   . Pneumonia   . Pneumothorax     There are no problems to display for this patient.   Past Surgical History:  Procedure Laterality Date  . ABDOMINAL HYSTERECTOMY    . LUNG SURGERY       OB History   No obstetric history on file.     No family history on file.  Social History   Tobacco Use  . Smoking status: Current Every Day Smoker    Packs/day: 0.50    Types: Cigarettes  . Smokeless tobacco: Never Used  Vaping Use  . Vaping Use: Never used  Substance Use Topics  . Alcohol use: No  . Drug use: Yes    Types: Marijuana    Home Medications Prior to Admission medications    Medication Sig Start Date End Date Taking? Authorizing Provider  albuterol (VENTOLIN HFA) 108 (90 Base) MCG/ACT inhaler Inhale 2 puffs into the lungs every 6 (six) hours as needed for wheezing or shortness of breath. Patient not taking: Reported on 04/19/2020 03/02/20   Claiborne Rigg, NP  [employer] TUSSIN DM 20-200 MG/10ML liquid TAKE 10 MLS BY MOUTH EVERY 4 (FOUR) HOURS AS NEEDED FOR COUGH. 06/21/20   Hoy Register, MD  dicyclomine (BENTYL) 10 MG capsule Take 1 capsule (10 mg total) by mouth 4 (four) times daily for 14 days. Patient not taking: Reported on 04/19/2020 01/11/20 04/19/20  Gilles Chiquito, MD  HYDROcodone-acetaminophen (NORCO/VICODIN) 5-325 MG tablet Take 1 tablet by mouth every 6 (six) hours as needed for severe pain. 04/19/20   Gwyneth Sprout, MD  ibuprofen (ADVIL) 800 MG tablet Take 1 tablet (800 mg total) by mouth every 8 (eight) hours as needed. Patient not taking: Reported on 04/19/2020 10/01/19   Claiborne Rigg, NP  naproxen (NAPROSYN) 500 MG tablet Take 1 tablet (500 mg total) by mouth 2 (two) times daily with a meal. 07/17/20   Jene Every, MD  ondansetron (ZOFRAN ODT) 4 MG disintegrating tablet Take 1 tablet (4 mg total) by mouth every 8 (eight) hours as needed for nausea or vomiting. 06/12/20   Claiborne Rigg, NP  phenazopyridine (PYRIDIUM) 200 MG tablet Take 1 tablet (200 mg total) by mouth 3 (three) times daily. 04/19/20  Gwyneth Sprout, MD  predniSONE (DELTASONE) 50 MG tablet Take 1 tablet (50 mg total) by mouth daily with breakfast. 07/17/20   Jene Every, MD  promethazine (PHENERGAN) 25 MG tablet Take 1 tablet (25 mg total) by mouth every 6 (six) hours as needed for nausea or vomiting. 04/19/20   Gwyneth Sprout, MD    Allergies    Bee venom  Review of Systems   Review of Systems  Constitutional: Negative for chills and fever.  HENT: Positive for congestion. Negative for sore throat.   Respiratory: Positive for cough and shortness of breath.    Cardiovascular: Positive for chest pain.  Gastrointestinal: Negative for abdominal pain, diarrhea, nausea and vomiting.  Genitourinary: Negative for enuresis.  Musculoskeletal: Negative for back pain.  Skin: Negative for rash.  Neurological: Positive for headaches. Negative for dizziness.  Hematological: Does not bruise/bleed easily.    Physical Exam Updated Vital Signs BP (!) 128/93   Pulse 77   Temp 98 F (36.7 C) (Oral)   Resp 11   Ht 5\' 2"  (1.575 m)   Wt 50 kg   SpO2 98%   BMI 20.16 kg/m   Physical Exam Vitals and nursing note reviewed.  Constitutional:      General: She is not in acute distress.    Appearance: She is not ill-appearing.  HENT:     Head: Normocephalic and atraumatic.     Right Ear: Ear canal and external ear normal. There is impacted cerumen.     Left Ear: There is impacted cerumen.     Nose: No congestion.     Mouth/Throat:     Mouth: Mucous membranes are moist.     Pharynx: Oropharynx is clear. No oropharyngeal exudate or posterior oropharyngeal erythema.  Eyes:     Conjunctiva/sclera: Conjunctivae normal.  Cardiovascular:     Rate and Rhythm: Normal rate and regular rhythm.     Pulses: Normal pulses.     Heart sounds: No murmur heard. No friction rub. No gallop.      Comments: Chest was palpated tender to palpation, can reproduce chest pain. Pulmonary:     Effort: No respiratory distress.     Breath sounds: No wheezing, rhonchi or rales.  Abdominal:     Palpations: Abdomen is soft.     Tenderness: There is no abdominal tenderness.  Musculoskeletal:     Right lower leg: No edema.     Left lower leg: No edema.  Skin:    General: Skin is warm and dry.  Neurological:     Mental Status: She is alert.  Psychiatric:        Mood and Affect: Mood normal.     ED Results / Procedures / Treatments   Labs (all labs ordered are listed, but only abnormal results are displayed) Labs Reviewed  BASIC METABOLIC PANEL - Abnormal; Notable for the  following components:      Result Value   Glucose, Bld 116 (*)    All other components within normal limits  CBC - Abnormal; Notable for the following components:   MCV 100.5 (*)    All other components within normal limits  I-STAT BETA HCG BLOOD, ED ([facility], [facility], [facility] ONLY)  TROPONIN I (HIGH SENSITIVITY)  TROPONIN I (HIGH SENSITIVITY)    EKG None  Radiology DG Chest 2 View  Result Date: 08/04/2020 CLINICAL DATA:  Chest pain. EXAM: CHEST - 2 VIEW COMPARISON:  July 17, 2020 FINDINGS: The heart size and mediastinal contours are within normal limits. Both  lungs are clear. The visualized skeletal structures are unremarkable. IMPRESSION: No active cardiopulmonary disease. Electronically Signed   By: Gerome Sam III M.D   On: 08/04/2020 18:42    Procedures Procedures   Medications Ordered in ED Medications  cyclobenzaprine (FLEXERIL) tablet 10 mg (10 mg Oral Given 08/04/20 2000)    ED Course  I have reviewed the triage vital signs and the nursing notes.  Pertinent labs & imaging results that were available during my care of the patient were reviewed by me and considered in my medical decision making (see chart for details).    MDM Rules/Calculators/A&P                         Initial impression-patient presents with chest pain and shortness of breath.  She is alert, does not appear in acute distress, vital signs reassuring.  Will obtain chest pain work-up, ambulate patient, provide her with muscle action reassess.  Work-up-CBC unremarkable, BMP shows slight hyperglycemia 116, initial troponin is less than 2, second troponin is 2.  HCG less than 5, chest x-ray negative for acute findings, EKG sinus without signs of ischemia no ST elevation depression noted.  Patient was ambulated did not become hypoxic or tachycardic  Reassessment patient was reassessed, has no complaints at this time, vital signs are remained stable patient is agreeable for discharge at this time.  Rule out- I  have low suspicion for ACS as history is atypical, patient has no cardiac history, EKG was sinus rhythm without signs of ischemia, patient had a delta troponin.  Low suspicion for PE as patient denies pleuritic chest pain, shortness of breath, patient denies leg pain, no pedal edema noted on exam, patient was PERC negative, patient was ambulated not become hypoxic or tachypneic.  Low suspicion for AAA or aortic dissection as history is atypical, patient has low risk factors.  Low suspicion for systemic infection as patient is nontoxic-appearing, vital signs reassuring, no obvious source infection noted on exam.  Plan- 1.  I suspect patient suffering from URI this would explain her cough and nasal congestion.  Will recommend over-the-counter pain medications follow-up with PCP in 1 week's time for reevaluation 2.  Patient has left-sided chest pain with unknown etiology suspect this secondary due to muscular strain.  Differential diagnosis includes costochondritis, anxiety, GERD, surgical angina, esophageal spasms will recommend over-the-counter pain medication follow-up with PCP for further evaluation  Vital signs have remained stable, no indication for hospital admission.  Patient given at home care as well strict return precautions.  Patient verbalized that they understood agreed to said plan.   Final Clinical Impression(s) / ED Diagnoses Final diagnoses:  Upper respiratory tract infection, unspecified type  Atypical chest pain    Rx / DC Orders ED Discharge Orders    None       Barnie Del 08/04/20 2143    Tilden Fossa, MD 08/04/20 2221

## 2020-08-04 NOTE — ED Notes (Signed)
Pt ambulated in room to the best of her ability (pt reports increased pain with activity). Oxygen level maintained at or above 98% throughout.

## 2020-08-04 NOTE — ED Triage Notes (Signed)
Patient complains of L chest pain since yesterday, SOB and a cough. States it feels like PNA.

## 2020-08-04 NOTE — Discharge Instructions (Addendum)
You have been seen here for URI like symptoms.  I recommend taking Tylenol for fever control and ibuprofen for pain control please follow dosing on the back of bottle.  I recommend staying hydrated and if you do not an appetite, I recommend soups as this will provide you with fluids and calories.     If symptoms continue after 1 to 2 weeks please follow-up with your primary care provider  Come back to the emergency department if you develop chest pain, shortness of breath, severe abdominal pain, uncontrolled nausea, vomiting, diarrhea.

## 2020-08-21 ENCOUNTER — Institutional Professional Consult (permissible substitution): Payer: Medicaid Other | Admitting: Pulmonary Disease

## 2020-10-20 ENCOUNTER — Encounter: Payer: Self-pay | Admitting: Internal Medicine

## 2020-10-20 ENCOUNTER — Other Ambulatory Visit: Payer: Self-pay

## 2020-10-20 ENCOUNTER — Ambulatory Visit: Payer: Self-pay | Attending: Internal Medicine | Admitting: Internal Medicine

## 2020-10-20 VITALS — BP 112/79 | HR 77 | Resp 16 | Wt 107.6 lb

## 2020-10-20 DIAGNOSIS — M5432 Sciatica, left side: Secondary | ICD-10-CM

## 2020-10-20 MED ORDER — CYCLOBENZAPRINE HCL 5 MG PO TABS: 5.0000 mg | ORAL_TABLET | Freq: Two times a day (BID) | ORAL | 1 refills | Status: DC | PRN

## 2020-10-20 MED ORDER — GABAPENTIN 300 MG PO CAPS: 300.0000 mg | ORAL_CAPSULE | Freq: Every day | ORAL | 0 refills | Status: DC

## 2020-10-20 MED ORDER — IBUPROFEN 800 MG PO TABS: 800.0000 mg | ORAL_TABLET | Freq: Three times a day (TID) | ORAL | 1 refills | Status: DC | PRN

## 2020-10-20 NOTE — Patient Instructions (Signed)
Take the Ibuprofen with food. Avoid any lifting, bending, pushing or pulling.

## 2020-10-20 NOTE — Progress Notes (Signed)
Patient ID: Lindsay Friedman, female    DOB: 02/20/1971  MRN: [id number]  CC: back pain  Subjective: Lindsay Friedman is a 50 y.o. female who presents for UC visit.  PCP is Lindsay Friedman. Her concerns today include:  Patient with history of tobacco dependence, migraines, COPD,  C/o flare of LBP which she has had for 1 year.  Flareup has been occurring over the past 1 month.  No initiating factors.  The pain is across the lower back and switches from one side to the other.  It is intermittent and can last a few days when it comes on.  Most of the times it is on the left side and radiates down the back of the leg to the level of the knee.  It is associated with feeling of pins-and-needles on the sole of the left foot.  No loss of bowel or bladder function.  Pain is a dull aching in character.  Worse when she is laying around and lying down.  Better when she is up moving around.  She has not had any falls.  She has been taking Advil without much relief.  Saw her PCP about a year ago for back pain and x-ray of the lumbar spine was ordered at that time but patient did not follow through on getting the imaging study done.    Current Outpatient Medications on File Prior to Visit  Medication Sig Dispense Refill  . albuterol (VENTOLIN HFA) 108 (90 Base) MCG/ACT inhaler Inhale 2 puffs into the lungs every 6 (six) hours as needed for wheezing or shortness of breath. (Patient not taking: Reported on 04/19/2020) 18 g 1  . [employer] TUSSIN DM 20-200 MG/10ML liquid TAKE 10 MLS BY MOUTH EVERY 4 (FOUR) HOURS AS NEEDED FOR COUGH. 237 mL 0  . HYDROcodone-acetaminophen (NORCO/VICODIN) 5-325 MG tablet Take 1 tablet by mouth every 6 (six) hours as needed for severe pain. 10 tablet 0  . ondansetron (ZOFRAN ODT) 4 MG disintegrating tablet Take 1 tablet (4 mg total) by mouth every 8 (eight) hours as needed for nausea or vomiting. 21 tablet 0  . phenazopyridine (PYRIDIUM) 200 MG tablet Take 1 tablet (200 mg total) by mouth 3 (three)  times daily. 6 tablet 0  . promethazine (PHENERGAN) 25 MG tablet Take 1 tablet (25 mg total) by mouth every 6 (six) hours as needed for nausea or vomiting. 30 tablet 0   No current facility-administered medications on file prior to visit.    Allergies  Allergen Reactions  . Bee Venom Anaphylaxis    Social History   Socioeconomic History  . Marital status: Divorced    Spouse name: Not on file  . Number of children: Not on file  . Years of education: Not on file  . Highest education level: Not on file  Occupational History  . Not on file  Tobacco Use  . Smoking status: Current Every Day Smoker    Packs/day: 0.50    Types: Cigarettes  . Smokeless tobacco: Never Used  Vaping Use  . Vaping Use: Never used  Substance and Sexual Activity  . Alcohol use: No  . Drug use: Yes    Types: Marijuana  . Sexual activity: Yes  Other Topics Concern  . Not on file  Social History Narrative  . Not on file   Social Determinants of Health   Financial Resource Strain: Not on file  Food Insecurity: Not on file  Transportation Needs: Not on file  Physical Activity: Not  on file  Stress: Not on file  Social Connections: Not on file  Intimate Partner Violence: Not on file    No family history on file.  Past Surgical History:  Procedure Laterality Date  . ABDOMINAL HYSTERECTOMY    . LUNG SURGERY      ROS: Review of Systems Negative except as stated above  PHYSICAL EXAM: BP 112/79   Pulse 77   Resp 16   Wt 107 lb 9.6 oz (48.8 kg)   SpO2 97%   BMI 19.68 kg/m   Physical Exam  General appearance - alert, well appearing, middle-age Caucasian female and in no distress Mental status - normal mood, behavior, speech, dress, motor activity, and thought processes Neurological -power proximal lower extremities 4/5 bilaterally.  Distal lower extremities 5/5 bilaterally.  Gross sensation intact in both lower extremities.  Ankle jerk and knee jerk reflexes are brisk. Musculoskeletal  -moderate tenderness on mild palpation of the lumbar spine and surrounding paraspinal muscles.  She is able to straight leg raise to greater than 70 degrees but reports pain in the lower back with doing so.  Gait is stable.    CMP Latest Ref Rng & Units 08/04/2020 07/17/2020 06/13/2020  Glucose 70 - 99 mg/dL 235(T) 98 92  BUN 6 - 20 mg/dL 6 10 8   Creatinine 0.44 - 1.00 mg/dL 6.14 4.31  Sodium 135 - 145 mmol/L 141 138 137  Potassium 3.5 - 5.1 mmol/L 3.7 4.0 4.1  Chloride 98 - 111 mmol/L 106 105 100  CO2 22 - 32 mmol/L 23 25 26   Calcium 8.9 - 10.3 mg/dL 9.6 9.5 9.8  Total Protein 6.5 - 8.1 g/dL - - 7.2  Total Bilirubin 0.3 - 1.2 mg/dL - - 0.5  Alkaline Phos 38 - 126 U/L - - 62  AST 15 - 41 U/L - - 23  ALT 0 - 44 U/L - - 12   Lipid Panel     Component Value Date/Time   CHOL 235 (H) 08/31/2019 1156   TRIG 173 (H) 08/31/2019 1156   HDL 58 08/31/2019 1156   CHOLHDL 4.1 08/31/2019 1156   LDLCALC 146 (H) 08/31/2019 1156    CBC    Component Value Date/Time   WBC 8.5 08/04/2020 1752   RBC 4.01 08/04/2020 1752   HGB 13.4 08/04/2020 1752   HGB 13.6 08/31/2019 1156   HCT 40.3 08/04/2020 1752   HCT 40.4 08/31/2019 1156   PLT 242 08/04/2020 1752   PLT 282 08/31/2019 1156   MCV 100.5 (H) 08/04/2020 1752   MCV 100 (H) 08/31/2019 1156   MCV 100 09/08/2013 1128   MCH 33.4 08/04/2020 1752   MCHC 33.3 08/04/2020 1752   RDW 12.6 08/04/2020 1752   RDW 12.8 08/31/2019 1156   RDW 13.2 09/08/2013 1128   LYMPHSABS 2.4 06/13/2020 1731   MONOABS 0.6 06/13/2020 1731   EOSABS 0.0 06/13/2020 1731   BASOSABS 0.0 06/13/2020 1731    ASSESSMENT AND PLAN:  1. Sciatica of left side -Likely pinched nerve in the lumbar spine. -Recommend no lifting, pushing pulling or excessive bending over the next 2 to 3 weeks. Use a heating pad when sitting or laying down as needed. -We will give 800 mg ibuprofen to use as needed.  Flexeril to use as needed.  Gabapentin to take at bedtime.  Advised that the  Flexeril and gabapentin can cause drowsiness.  Follow-up with her PCP if no improvement or any worsening.  - ibuprofen (ADVIL) 800 MG tablet; Take 1  tablet (800 mg total) by mouth every 8 (eight) hours as needed.  Dispense: 60 tablet; Refill: 1  Patient was given the opportunity to ask questions.  Patient verbalized understanding of the plan and was able to repeat key elements of the plan.      Requested Prescriptions   Signed Prescriptions Disp Refills  . cyclobenzaprine (FLEXERIL) 5 MG tablet 30 tablet 1    Sig: Take 1 tablet (5 mg total) by mouth 2 (two) times daily as needed for muscle spasms.  Marland Kitchen gabapentin (NEURONTIN) 300 MG capsule 30 capsule 0    Sig: Take 1 capsule (300 mg total) by mouth at bedtime.  Marland Kitchen ibuprofen (ADVIL) 800 MG tablet 60 tablet 1    Sig: Take 1 tablet (800 mg total) by mouth every 8 (eight) hours as needed.    Return in about 6 weeks (around 12/01/2020) for Give follow up with Lindsay Friedman in 6 wks.  Jonah Blue, MD, FACP

## 2020-11-05 ENCOUNTER — Emergency Department (HOSPITAL_COMMUNITY): Payer: Medicaid Other

## 2020-11-05 ENCOUNTER — Encounter (HOSPITAL_COMMUNITY): Payer: Self-pay

## 2020-11-05 ENCOUNTER — Other Ambulatory Visit: Payer: Self-pay

## 2020-11-05 ENCOUNTER — Emergency Department (HOSPITAL_COMMUNITY)
Admission: EM | Admit: 2020-11-05 | Discharge: 2020-11-05 | Disposition: A | Discharge status: Home / Self Care | Payer: Medicaid Other | Attending: Student | Admitting: Student

## 2020-11-05 DIAGNOSIS — R1110 Vomiting, unspecified: Secondary | ICD-10-CM | POA: Insufficient documentation

## 2020-11-05 DIAGNOSIS — Z5321 Procedure and treatment not carried out due to patient leaving prior to being seen by health care provider: Secondary | ICD-10-CM | POA: Insufficient documentation

## 2020-11-05 DIAGNOSIS — M791 Myalgia, unspecified site: Secondary | ICD-10-CM | POA: Insufficient documentation

## 2020-11-05 DIAGNOSIS — R519 Headache, unspecified: Secondary | ICD-10-CM | POA: Insufficient documentation

## 2020-11-05 DIAGNOSIS — J029 Acute pharyngitis, unspecified: Secondary | ICD-10-CM | POA: Insufficient documentation

## 2020-11-05 DIAGNOSIS — R197 Diarrhea, unspecified: Secondary | ICD-10-CM | POA: Insufficient documentation

## 2020-11-05 DIAGNOSIS — R002 Palpitations: Secondary | ICD-10-CM | POA: Insufficient documentation

## 2020-11-05 LAB — COMPREHENSIVE METABOLIC PANEL
ALT: 17 U/L (ref 0–44)
AST: 24 U/L (ref 15–41)
Albumin: 4 g/dL (ref 3.5–5.0)
Alkaline Phosphatase: 74 U/L (ref 38–126)
Anion gap: 9 (ref 5–15)
BUN: 11 mg/dL (ref 6–20)
CO2: 28 mmol/L (ref 22–32)
Calcium: 9 mg/dL (ref 8.9–10.3)
Chloride: 99 mmol/L (ref 98–111)
Creatinine, Ser: 0.69 mg/dL (ref 0.44–1.00)
GFR, Estimated: >60 mL/min (ref >60)
Glucose, Bld: 105 mg/dL — ABNORMAL HIGH (ref 70–99)
Potassium: 3.7 mmol/L (ref 3.5–5.1)
Sodium: 136 mmol/L (ref 135–145)
Total Bilirubin: 0.5 mg/dL (ref 0.3–1.2)
Total Protein: 7.2 g/dL (ref 6.5–8.1)

## 2020-11-05 LAB — CBC WITH DIFFERENTIAL/PLATELET
Abs Immature Granulocytes: 0 10*3/uL (ref 0.00–0.07)
Basophils Absolute: 0 10*3/uL (ref 0.0–0.1)
Basophils Relative: 0 %
Eosinophils Absolute: 0 10*3/uL (ref 0.0–0.5)
Eosinophils Relative: 0 %
HCT: 43.4 % (ref 36.0–46.0)
Hemoglobin: 14.6 g/dL (ref 12.0–15.0)
Lymphocytes Relative: 38 %
Lymphs Abs: 4 10*3/uL (ref 0.7–4.0)
MCH: 33.1 pg (ref 26.0–34.0)
MCHC: 33.6 g/dL (ref 30.0–36.0)
MCV: 98.4 fL (ref 80.0–100.0)
Monocytes Absolute: 0.2 10*3/uL (ref 0.1–1.0)
Monocytes Relative: 2 %
Neutro Abs: 6.2 10*3/uL (ref 1.7–7.7)
Neutrophils Relative %: 60 %
Platelets: 233 10*3/uL (ref 150–400)
RBC: 4.41 MIL/uL (ref 3.87–5.11)
RDW: 12.4 % (ref 11.5–15.5)
WBC: 10.4 10*3/uL (ref 4.0–10.5)
nRBC: 0 % (ref 0.0–0.2)
nRBC: 0 /100 WBC

## 2020-11-05 LAB — MAGNESIUM: Magnesium: 2 mg/dL (ref 1.7–2.4)

## 2020-11-05 LAB — TSH: TSH: 4.53 u[IU]/mL — ABNORMAL HIGH (ref 0.350–4.500)

## 2020-11-05 LAB — LIPASE, BLOOD: Lipase: 28 U/L (ref 11–51)

## 2020-11-05 LAB — TROPONIN I (HIGH SENSITIVITY): Troponin I (High Sensitivity): <2 ng/L (ref <18)

## 2020-11-05 MED ORDER — IBUPROFEN 400 MG PO TABS: 600.0000 mg | ORAL_TABLET | Freq: Once | ORAL | Status: DC

## 2020-11-05 MED ORDER — ONDANSETRON 4 MG PO TBDP: 4.0000 mg | ORAL_TABLET | Freq: Once | ORAL | Status: DC

## 2020-11-05 NOTE — ED Notes (Signed)
Pt called for vital sign recheck with no response. Pt not visualized in the lobby.

## 2020-11-05 NOTE — ED Provider Notes (Signed)
Emergency Medicine Provider Triage Evaluation Note  Lindsay Friedman , a 50 y.o. female  was evaluated in triage.  Pt complains of nausea, vomiting with NBNB emesis, body aches, diarrhea without melena or hematochezia, nonproductive cough and chest tightness times nearly 1 week.  She is vaccinating is COVID-19.  History of COPD as well as multiple pneumothoraces secondary to bleb ruptures.  Review of Systems  Positive: Cough, shortness of breath, chest pain, palpitations, nausea, vomiting, diarrhea, myalgias, fevers Negative: Rash, melena, hematochezia  Physical Exam  BP 118/90 (BP Location: Left Arm)   Pulse 81   Temp 97.6 F (36.4 C) (Oral)   Resp 15   SpO2 97%  Gen:   Awake, no distress   Resp:  Normal effort, cough MSK:   Moves extremities without difficulty  Other:  Abdomen soft, nondistended, mild RLQ TTP.  RRR, no M/R/G.  Lungs CTA B.  Medical Decision Making  Medically screening exam initiated at 5:09 PM.  Appropriate orders placed.  Lindsay Friedman was informed that the remainder of the evaluation will be completed by another provider, this initial triage assessment does not replace that evaluation, and the importance of remaining in the ED until their evaluation is complete.  This chart was dictated using voice recognition software, Dragon. Despite the best efforts of this provider to proofread and correct errors, errors may still occur which can change documentation meaning.    Sherrilee Gilles 11/05/20 1717    Koleen Distance, MD 11/05/20 2017

## 2020-11-05 NOTE — ED Notes (Signed)
Pt called again for vital sign recheck with no response. Pt again not visualized in the lobby.

## 2020-11-05 NOTE — ED Triage Notes (Signed)
Patient complains of 1 week of body aches, palpitations, vomiting, sore throat, headache. Patient had negative covid test and using otc meds with no relief. Patient alert and oriented. Reports intermittent diarrhea with same

## 2020-11-07 ENCOUNTER — Encounter (HOSPITAL_COMMUNITY): Payer: Self-pay | Admitting: Emergency Medicine

## 2020-11-07 ENCOUNTER — Emergency Department (HOSPITAL_COMMUNITY)
Admission: EM | Admit: 2020-11-07 | Discharge: 2020-11-07 | Disposition: A | Discharge status: Home / Self Care | Payer: Medicaid Other | Attending: Emergency Medicine | Admitting: Emergency Medicine

## 2020-11-07 ENCOUNTER — Other Ambulatory Visit: Payer: Self-pay

## 2020-11-07 DIAGNOSIS — R059 Cough, unspecified: Secondary | ICD-10-CM | POA: Insufficient documentation

## 2020-11-07 DIAGNOSIS — F17210 Nicotine dependence, cigarettes, uncomplicated: Secondary | ICD-10-CM | POA: Insufficient documentation

## 2020-11-07 DIAGNOSIS — R509 Fever, unspecified: Secondary | ICD-10-CM | POA: Insufficient documentation

## 2020-11-07 DIAGNOSIS — R531 Weakness: Secondary | ICD-10-CM | POA: Insufficient documentation

## 2020-11-07 DIAGNOSIS — J449 Chronic obstructive pulmonary disease, unspecified: Secondary | ICD-10-CM | POA: Insufficient documentation

## 2020-11-07 MED ORDER — PREDNISONE 20 MG PO TABS: 40.0000 mg | ORAL_TABLET | Freq: Every day | ORAL | 0 refills | Status: DC

## 2020-11-07 MED ORDER — ALBUTEROL SULFATE HFA 108 (90 BASE) MCG/ACT IN AERS
2.0000 | INHALATION_SPRAY | RESPIRATORY_TRACT | Status: DC | PRN
  Filled 2020-11-07: qty 6.7

## 2020-11-07 MED ORDER — AZITHROMYCIN 250 MG PO TABS: 250.0000 mg | ORAL_TABLET | Freq: Every day | ORAL | 0 refills | Status: DC

## 2020-11-07 NOTE — ED Triage Notes (Signed)
Pt arriving POV with complaint of generalized weakness, cough, dizziness, headache, and n/v x 2 weeks. Seen on 6/19 for same. Pt A&O x4 and ambulatory. No fever at this time

## 2020-11-07 NOTE — ED Notes (Signed)
Patient given avs and reviewed.

## 2020-11-07 NOTE — ED Provider Notes (Signed)
[facility]-EMERGENCY DEPT Provider Note     CSN: [id number] Arrival date & time: 11/07/20  1800     History    Chief Complaint  Patient presents with   Weakness      Lindsay Friedman is a 50 y.o. female.   Patient presents to the emergency department with a chief complaint of cough x2 weeks.  She states that she has history of emphysema.  She reports subjective nighttime fevers.  She was seen in triage 2 days ago for the same and had reassuring lab work and chest x-ray without evidence of pneumonia.  She states she has taken a home COVID test that was negative.  She is requesting an antibiotic due to the length of symptoms.   The history is provided by the patient. No language interpreter was used.          Past Medical History:  Diagnosis Date   COPD (chronic obstructive pulmonary disease) (HCC)     Kidney stone     Migraines     Pneumonia     Pneumothorax        There are no problems to display for this patient.          Past Surgical History:  Procedure Laterality Date   ABDOMINAL HYSTERECTOMY       LUNG SURGERY                     OB History   No obstetric history on file.        History reviewed. No pertinent family history.   Social History         Tobacco Use   Smoking status: Every Day      Packs/day: 0.50      Pack years: 0.00      Types: Cigarettes   Smokeless tobacco: Never  Vaping Use   Vaping Use: Never used  Substance Use Topics   Alcohol use: No   Drug use: Yes      Types: Marijuana      Home Medications        Prior to Admission medications  Medication Sig Start Date End Date Taking? Authorizing Provider  albuterol (VENTOLIN HFA) 108 (90 Base) MCG/ACT inhaler Inhale 2 puffs into the lungs every 6 (six) hours as needed for wheezing or shortness of breath. Patient not taking: Reported on 04/19/2020 03/02/20     Claiborne Rigg, NP  [facility] TUSSIN DM 20-200 MG/10ML liquid TAKE 10 MLS BY MOUTH EVERY 4 (FOUR) HOURS AS  NEEDED FOR COUGH. 06/21/20     Hoy Register, MD  cyclobenzaprine (FLEXERIL) 5 MG tablet Take 1 tablet (5 mg total) by mouth 2 (two) times daily as needed for muscle spasms. 10/20/20     Marcine Matar, MD  gabapentin (NEURONTIN) 300 MG capsule Take 1 capsule (300 mg total) by mouth at bedtime. 10/20/20     Marcine Matar, MD  HYDROcodone-acetaminophen (NORCO/VICODIN) 5-325 MG tablet Take 1 tablet by mouth every 6 (six) hours as needed for severe pain. 04/19/20     Gwyneth Sprout, MD  ibuprofen (ADVIL) 800 MG tablet Take 1 tablet (800 mg total) by mouth every 8 (eight) hours as needed. 10/20/20     Marcine Matar, MD  ondansetron (ZOFRAN ODT) 4 MG disintegrating tablet Take 1 tablet (4 mg total) by mouth every 8 (eight) hours as needed for nausea or vomiting. 06/12/20     Claiborne Rigg, NP  phenazopyridine (PYRIDIUM) 200 MG tablet Take 1 tablet (200 mg total) by mouth 3 (three) times daily. 04/19/20     Gwyneth Sprout, MD  promethazine (PHENERGAN) 25 MG tablet Take 1 tablet (25 mg total) by mouth every 6 (six) hours as needed for nausea or vomiting. 04/19/20     Gwyneth Sprout, MD      Allergies            Bee venom   Review of Systems   Review of Systems  All other systems reviewed and are negative.   Physical Exam Updated Vital Signs BP (!) 160/90 (BP Location: Left Arm)   Pulse 94   Temp 97.8 F (36.6 C) (Oral)   Resp 16   SpO2 94%    Physical Exam Vitals and nursing note reviewed. Constitutional:      General: She is not in acute distress.    Appearance: She is well-developed. HENT:    Head: Normocephalic and atraumatic. Eyes:    Conjunctiva/sclera: Conjunctivae normal. Cardiovascular:    Rate and Rhythm: Normal rate and regular rhythm.    Heart sounds: No murmur heard. Pulmonary:    Effort: Pulmonary effort is normal. No respiratory distress.    Breath sounds: Normal breath sounds. Abdominal:    Palpations: Abdomen is soft.    Tenderness: There is no  abdominal tenderness. Musculoskeletal:        General: Normal range of motion.    Cervical back: Neck supple. Skin:    General: Skin is warm and dry. Neurological:    Mental Status: She is alert and oriented to person, place, and time. Psychiatric:        Mood and Affect: Mood normal.        Behavior: Behavior normal.     ED Results / Procedures / Treatments   Labs (all labs ordered are listed, but only abnormal results are displayed) Labs Reviewed - No data to display   EKG None   Radiology Imaging Results (Last 48 hours)  No results found.     Procedures    Medications Ordered in ED Medications - No data to display   ED Course  I have reviewed the triage vital signs and the nursing notes.   Pertinent labs & imaging results that were available during my care of the patient were reviewed by me and considered in my medical decision making (see chart for details).   MDM Rules/Calculators/A&P                           Patient here with cough x 2 weeks.  Subjective fevers.  Reassuring labs and CXR from 2 days ago.  Negative home COVID.  Hx of COPD and emphysema.  Will treat with azithro and prednisone.   Roxy Horseman, PA-C 11/07/20 2252    Benjiman Core, MD 11/08/20 [phone]

## 2020-11-27 ENCOUNTER — Other Ambulatory Visit: Payer: Self-pay | Admitting: Internal Medicine

## 2020-11-28 ENCOUNTER — Other Ambulatory Visit: Payer: Self-pay | Admitting: Internal Medicine

## 2020-11-28 MED ORDER — GABAPENTIN 300 MG PO CAPS: 300.0000 mg | ORAL_CAPSULE | Freq: Every day | ORAL | 0 refills | Status: DC

## 2020-12-02 IMAGING — CT CT RENAL STONE PROTOCOL
2 of 4 series · 16 of 46 positions shown, 18 images · non-contrast
Comparison: October 12, 2018

CLINICAL DATA: Flank pain.

EXAM:
CT ABDOMEN AND PELVIS WITHOUT CONTRAST
TECHNIQUE: Multidetector CT imaging of the abdomen and pelvis was performed
following the standard protocol without IV contrast.

[Series 2: renal stone 5.0 · axial · 0.66mm/px · z∈[+1066,+1446]mm · 13 of 84 slices shown, 15 images]
[im 4/84  soft-tissue]
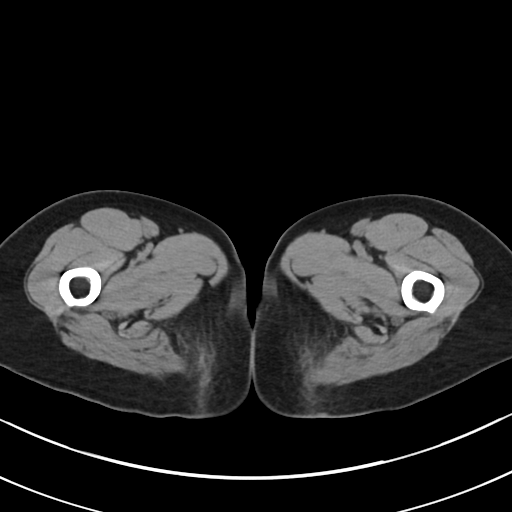
[im 4/84  bone]
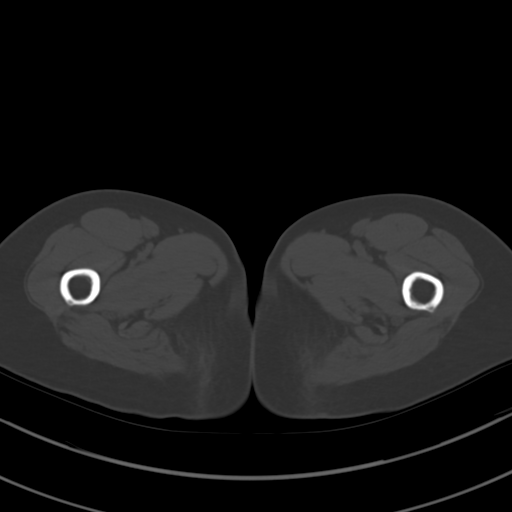
[im 10/84  soft-tissue]
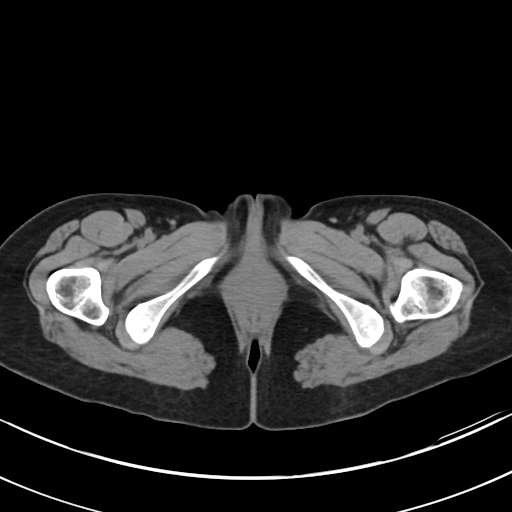
[im 17/84  soft-tissue]
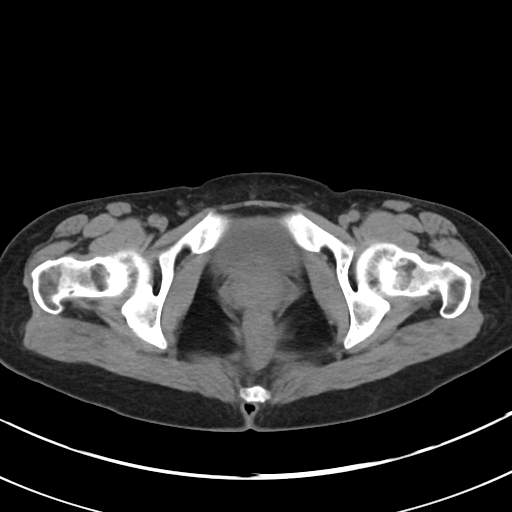
[im 24/84  soft-tissue]
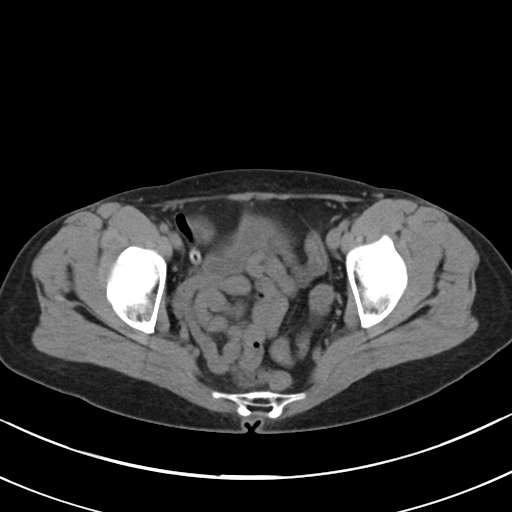
[im 30/84  soft-tissue]
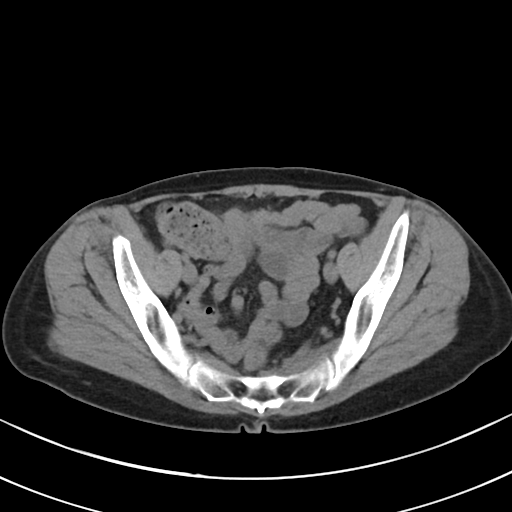
[im 37/84  soft-tissue]
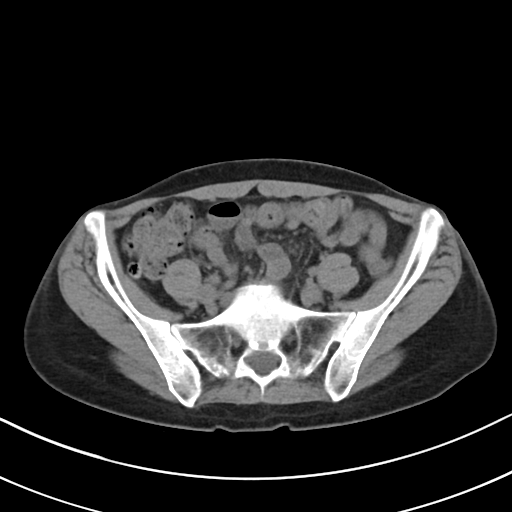
[im 44/84  soft-tissue]
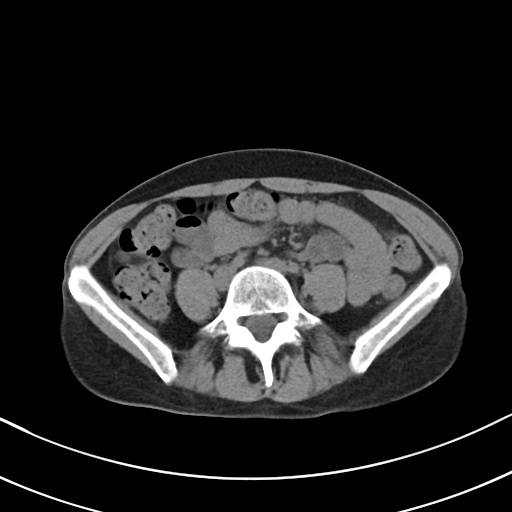
[im 47/84  soft-tissue]
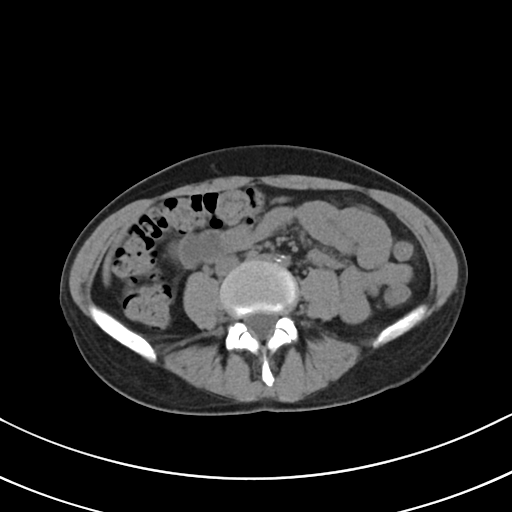
[im 54/84  soft-tissue]
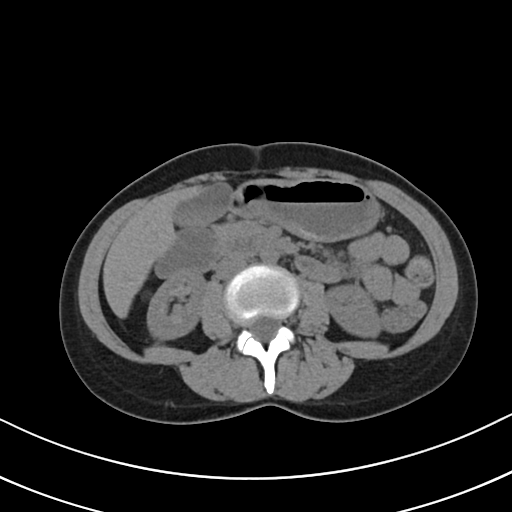
[im 54/84  bone]
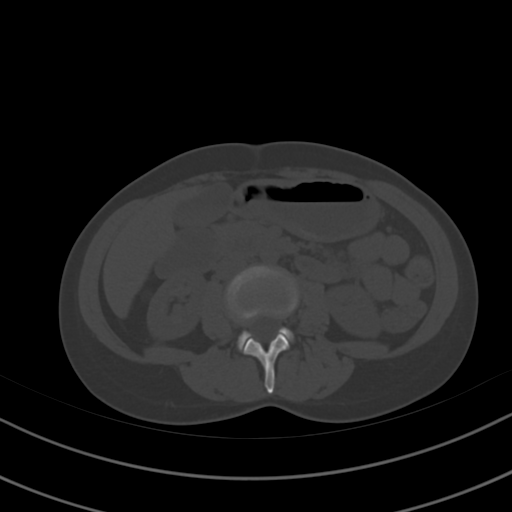
[im 60/84  soft-tissue]
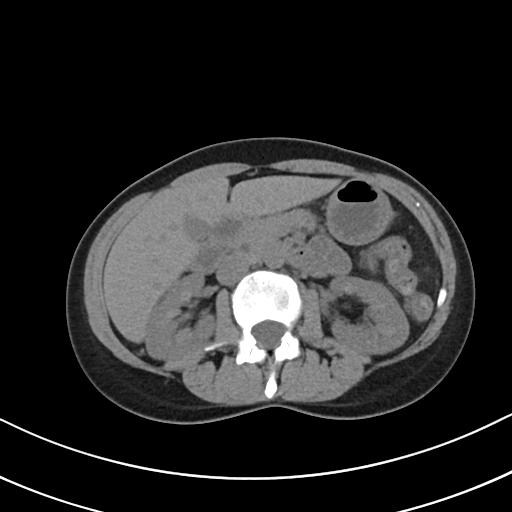
[im 67/84  soft-tissue]
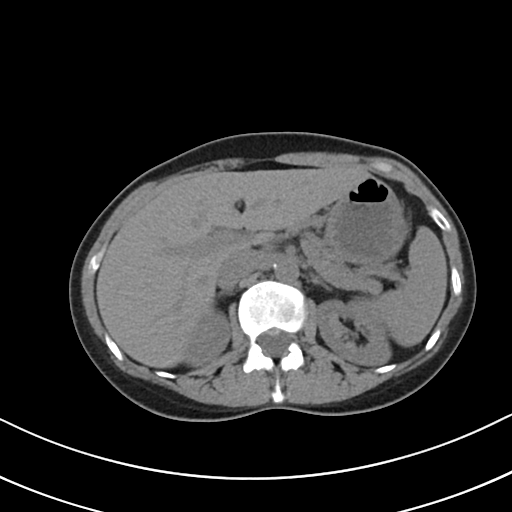
[im 74/84  soft-tissue]
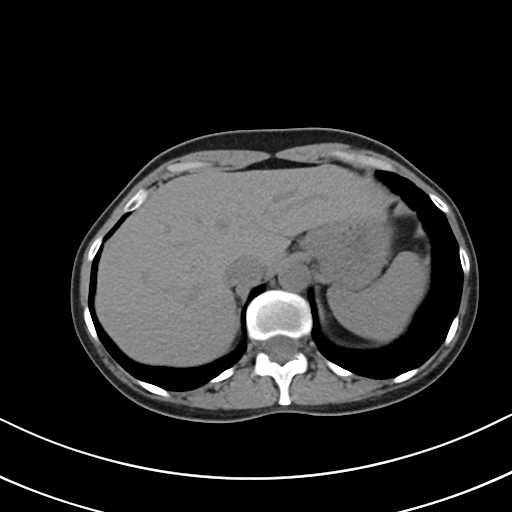
[im 80/84  soft-tissue]
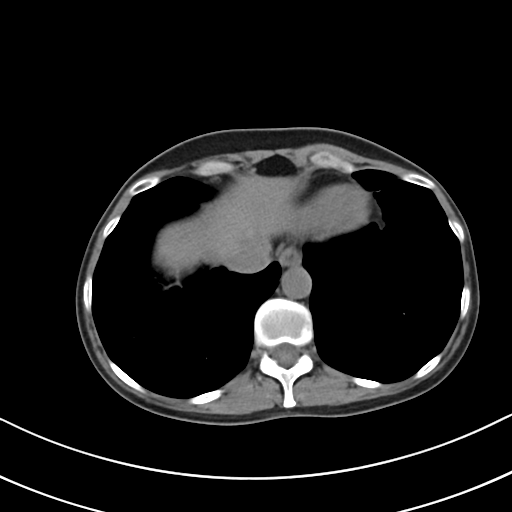

[Series 5: coronal · coronal · 0.70mm/px · 3 of 78 slices shown]
[im 26/78  soft-tissue]
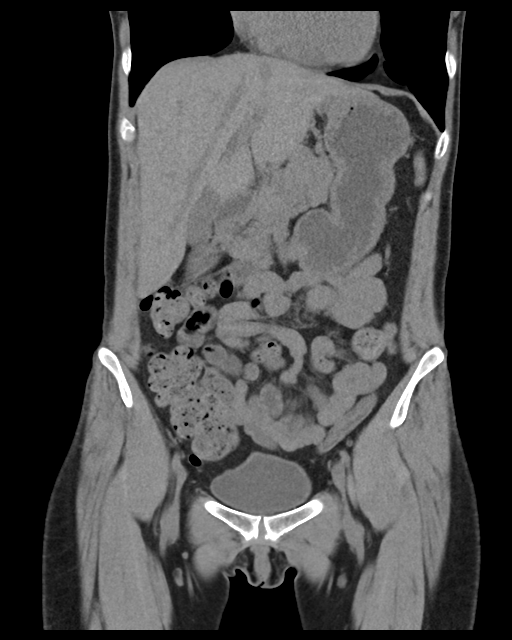
[im 35/78  soft-tissue]
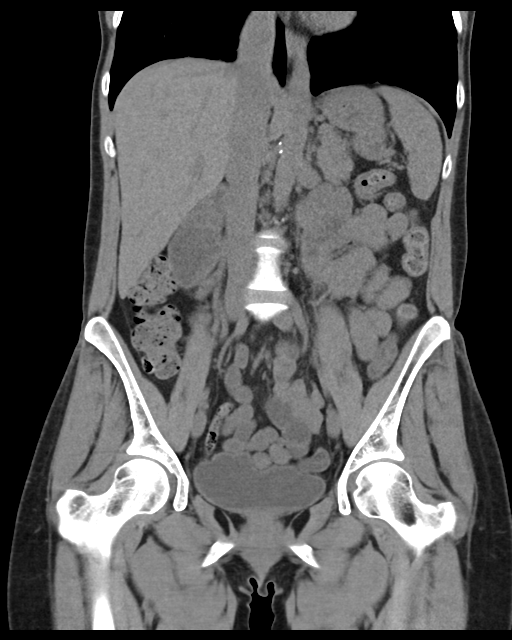
[im 43/78  soft-tissue]
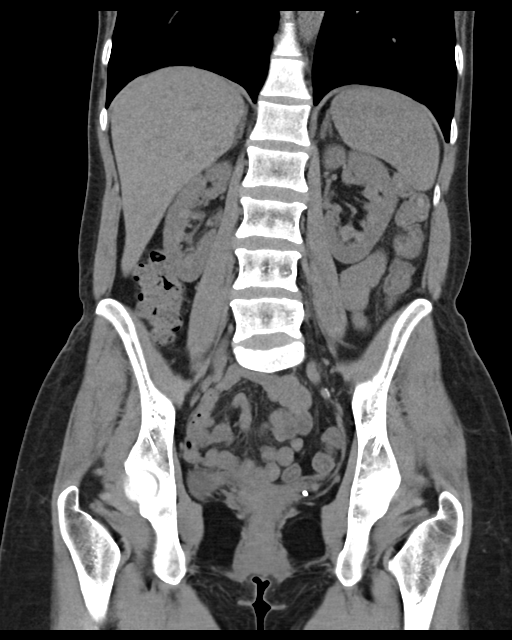

[16 of 46 positions shown; findings below may reference images not displayed]

FINDINGS: Lower chest: There is mild emphysematous lung disease noted within
the bilateral lung bases.

Hepatobiliary: No focal liver abnormality is seen. No gallstones,
gallbladder wall thickening, or biliary dilatation.

Pancreas: Unremarkable. No pancreatic ductal dilatation or
surrounding inflammatory changes.

Spleen: Normal in size without focal abnormality.

Adrenals/Urinary Tract: Adrenal glands are unremarkable. Kidneys are
normal, without renal calculi, focal lesion, or hydronephrosis.
Bladder is unremarkable.

Stomach/Bowel: Stomach is within normal limits. Appendix appears
normal. No evidence of bowel wall thickening, distention, or
inflammatory changes. Noninflamed diverticula are seen within the
region of the proximal ascending colon.

Vascular/Lymphatic: Mild aortic calcification. No enlarged abdominal
or pelvic lymph nodes.

Reproductive: Status post hysterectomy. No adnexal masses.

Other: No abdominal wall hernia or abnormality. No abdominopelvic
ascites.

Musculoskeletal: No acute or significant osseous findings.
IMPRESSION: 1. No evidence of acute or active process within the abdomen or
pelvis.
2. Noninflamed diverticula within the region of the proximal
ascending colon.
3. Mild emphysematous lung disease.
4. Aortic atherosclerosis.

Aortic Atherosclerosis (2GFTQ-LR1.1) and Emphysema (2GFTQ-ZEA.D).

## 2021-02-07 ENCOUNTER — Emergency Department (HOSPITAL_COMMUNITY)
Admission: EM | Admit: 2021-02-07 | Discharge: 2021-02-08 | Disposition: A | Discharge status: Home / Self Care | Payer: Medicaid Other | Attending: Physician Assistant | Admitting: Physician Assistant

## 2021-02-07 ENCOUNTER — Encounter (HOSPITAL_COMMUNITY): Payer: Self-pay

## 2021-02-07 ENCOUNTER — Emergency Department (HOSPITAL_COMMUNITY): Payer: Medicaid Other

## 2021-02-07 ENCOUNTER — Other Ambulatory Visit: Payer: Self-pay

## 2021-02-07 DIAGNOSIS — Z5321 Procedure and treatment not carried out due to patient leaving prior to being seen by health care provider: Secondary | ICD-10-CM | POA: Insufficient documentation

## 2021-02-07 DIAGNOSIS — R0602 Shortness of breath: Secondary | ICD-10-CM | POA: Insufficient documentation

## 2021-02-07 DIAGNOSIS — J449 Chronic obstructive pulmonary disease, unspecified: Secondary | ICD-10-CM | POA: Insufficient documentation

## 2021-02-07 DIAGNOSIS — N644 Mastodynia: Secondary | ICD-10-CM | POA: Insufficient documentation

## 2021-02-07 LAB — COMPREHENSIVE METABOLIC PANEL
ALT: 12 U/L (ref 0–44)
AST: 23 U/L (ref 15–41)
Albumin: 4.3 g/dL (ref 3.5–5.0)
Alkaline Phosphatase: 84 U/L (ref 38–126)
Anion gap: 8 (ref 5–15)
BUN: 8 mg/dL (ref 6–20)
CO2: 28 mmol/L (ref 22–32)
Calcium: 10.3 mg/dL (ref 8.9–10.3)
Chloride: 108 mmol/L (ref 98–111)
Creatinine, Ser: 0.54 mg/dL (ref 0.44–1.00)
GFR, Estimated: >60 mL/min (ref >60)
Glucose, Bld: 108 mg/dL — ABNORMAL HIGH (ref 70–99)
Potassium: 4.3 mmol/L (ref 3.5–5.1)
Sodium: 144 mmol/L (ref 135–145)
Total Bilirubin: 0.4 mg/dL (ref 0.3–1.2)
Total Protein: 7.8 g/dL (ref 6.5–8.1)

## 2021-02-07 LAB — CBC WITH DIFFERENTIAL/PLATELET
Abs Immature Granulocytes: 0.02 10*3/uL (ref 0.00–0.07)
Basophils Absolute: 0 10*3/uL (ref 0.0–0.1)
Basophils Relative: 0 %
Eosinophils Absolute: 0.2 10*3/uL (ref 0.0–0.5)
Eosinophils Relative: 2 %
HCT: 41.8 % (ref 36.0–46.0)
Hemoglobin: 13.9 g/dL (ref 12.0–15.0)
Immature Granulocytes: 0 %
Lymphocytes Relative: 37 %
Lymphs Abs: 3.5 10*3/uL (ref 0.7–4.0)
MCH: 33.1 pg (ref 26.0–34.0)
MCHC: 33.3 g/dL (ref 30.0–36.0)
MCV: 99.5 fL (ref 80.0–100.0)
Monocytes Absolute: 0.6 10*3/uL (ref 0.1–1.0)
Monocytes Relative: 6 %
Neutro Abs: 5.2 10*3/uL (ref 1.7–7.7)
Neutrophils Relative %: 55 %
Platelets: 265 10*3/uL (ref 150–400)
RBC: 4.2 MIL/uL (ref 3.87–5.11)
RDW: 12.9 % (ref 11.5–15.5)
WBC: 9.4 10*3/uL (ref 4.0–10.5)
nRBC: 0 % (ref 0.0–0.2)

## 2021-02-07 LAB — TROPONIN I (HIGH SENSITIVITY)
Troponin I (High Sensitivity): <2 ng/L (ref <18)
Troponin I (High Sensitivity): <2 ng/L (ref <18)

## 2021-02-07 LAB — HCG, SERUM, QUALITATIVE: Preg, Serum: NEGATIVE

## 2021-02-07 NOTE — ED Provider Notes (Signed)
Emergency Medicine Provider Triage Evaluation Note  Lindsay Friedman , a 50 y.o. female  was evaluated in triage.  Pt complains of left chest pain that started few days ago.  Symptoms are worsening.  Reports associated cough and shortness of breath.  She does have a history of COPD.  States that her chest pain is along the left breast and radiates into the left axillary region.  Reports her pain worsens with palpation.  Physical Exam  BP 134/84 (BP Location: Right Arm)   Pulse 85   Temp 98.1 F (36.7 C) (Oral)   Resp 16   SpO2 100%  Gen:   Awake, no distress   Resp:  Normal effort  MSK:   Moves extremities without difficulty  Other:  Female nursing chaperone present.  No overlying skin changes noted around the left breast and left axillary region.  Moderate tenderness noted diffusely in the region.  Medical Decision Making  Medically screening exam initiated at 5:47 PM.  Appropriate orders placed.  JAKEIRA SEEMAN was informed that the remainder of the evaluation will be completed by another provider, this initial triage assessment does not replace that evaluation, and the importance of remaining in the ED until their evaluation is complete.   Placido Sou, PA-C 02/07/21 1748    Tegeler, Canary Brim, MD 02/07/21 [phone]

## 2021-02-07 NOTE — ED Triage Notes (Signed)
Pt reports left breast pain and shob x3 days, swelling noted, denies pain with palpitation. Hx of collapsed lung

## 2021-03-22 ENCOUNTER — Emergency Department (HOSPITAL_COMMUNITY)
Admission: EM | Admit: 2021-03-22 | Discharge: 2021-03-22 | Disposition: A | Discharge status: Home / Self Care | Payer: Self-pay | Attending: Emergency Medicine | Admitting: Emergency Medicine

## 2021-03-22 ENCOUNTER — Other Ambulatory Visit: Payer: Self-pay

## 2021-03-22 ENCOUNTER — Emergency Department (HOSPITAL_COMMUNITY): Payer: Self-pay

## 2021-03-22 DIAGNOSIS — F17210 Nicotine dependence, cigarettes, uncomplicated: Secondary | ICD-10-CM | POA: Insufficient documentation

## 2021-03-22 DIAGNOSIS — Z20822 Contact with and (suspected) exposure to covid-19: Secondary | ICD-10-CM | POA: Insufficient documentation

## 2021-03-22 DIAGNOSIS — J449 Chronic obstructive pulmonary disease, unspecified: Secondary | ICD-10-CM | POA: Insufficient documentation

## 2021-03-22 DIAGNOSIS — J101 Influenza due to other identified influenza virus with other respiratory manifestations: Secondary | ICD-10-CM | POA: Insufficient documentation

## 2021-03-22 LAB — BASIC METABOLIC PANEL
Anion gap: 8 (ref 5–15)
BUN: 6 mg/dL (ref 6–20)
CO2: 26 mmol/L (ref 22–32)
Calcium: 8.9 mg/dL (ref 8.9–10.3)
Chloride: 104 mmol/L (ref 98–111)
Creatinine, Ser: 0.61 mg/dL (ref 0.44–1.00)
GFR, Estimated: >60 mL/min (ref >60)
Glucose, Bld: 115 mg/dL — ABNORMAL HIGH (ref 70–99)
Potassium: 3.1 mmol/L — ABNORMAL LOW (ref 3.5–5.1)
Sodium: 138 mmol/L (ref 135–145)

## 2021-03-22 LAB — CBC
HCT: 38.4 % (ref 36.0–46.0)
Hemoglobin: 13 g/dL (ref 12.0–15.0)
MCH: 32.3 pg (ref 26.0–34.0)
MCHC: 33.9 g/dL (ref 30.0–36.0)
MCV: 95.5 fL (ref 80.0–100.0)
Platelets: 149 10*3/uL — ABNORMAL LOW (ref 150–400)
RBC: 4.02 MIL/uL (ref 3.87–5.11)
RDW: 12.3 % (ref 11.5–15.5)
WBC: 5.8 10*3/uL (ref 4.0–10.5)
nRBC: 0 % (ref 0.0–0.2)

## 2021-03-22 LAB — RESP PANEL BY RT-PCR (FLU A&B, COVID) ARPGX2
Influenza A by PCR: POSITIVE — AB
Influenza B by PCR: NEGATIVE
SARS Coronavirus 2 by RT PCR: NEGATIVE

## 2021-03-22 LAB — TROPONIN I (HIGH SENSITIVITY)
Troponin I (High Sensitivity): <2 ng/L (ref <18)
Troponin I (High Sensitivity): 2 ng/L (ref <18)

## 2021-03-22 MED ORDER — KETOROLAC TROMETHAMINE 30 MG/ML IJ SOLN
15.0000 mg | Freq: Once | INTRAMUSCULAR | Status: AC
  Administered 2021-03-22: 15 mg via INTRAMUSCULAR
  Filled 2021-03-22: qty 1

## 2021-03-22 NOTE — ED Notes (Signed)
Patient Alert and oriented to baseline. Stable and ambulatory to baseline. Patient verbalized understanding of the discharge instructions.  Patient belongings were taken by the patient.   

## 2021-03-22 NOTE — ED Triage Notes (Signed)
Pt reports flu like symptoms since last weekend. Has cough, chest wall pain, fever, bodyaches. No resp distress is noted.

## 2021-03-22 NOTE — Discharge Instructions (Addendum)
You have been diagnosed with influenza.  This disease can take up to 2 weeks to improve.  Monitor your condition carefully and do not hesitate to return here for concerning changes otherwise follow-up with your physician.

## 2021-03-22 NOTE — ED Provider Notes (Addendum)
[facility] EMERGENCY DEPARTMENT Provider Note   CSN: [id number] Arrival date & time: 03/22/21  1327     History Chief Complaint  Patient presents with   Cough   Fever    Lindsay Friedman is a 50 y.o. female.  HPI Patient with a history of COPD, though a non-smoker presents with symptoms for 5 days.  Onset was gradual.  Now over 5 days patient has had myalgia, fatigue, cough, left-sided chest wall pain worse with coughing.  Chest wall pain is sore, not improved with OTC medication.  No fever at home.  There is nausea, but no vomiting.  She states her stomach is unsettled, though not painful.    Past Medical History:  Diagnosis Date   COPD (chronic obstructive pulmonary disease) (HCC)    Kidney stone    Migraines    Pneumonia    Pneumothorax     There are no problems to display for this patient.   Past Surgical History:  Procedure Laterality Date   ABDOMINAL HYSTERECTOMY     LUNG SURGERY       OB History   No obstetric history on file.     No family history on file.  Social History   Tobacco Use   Smoking status: Every Day    Packs/day: 0.50    Types: Cigarettes   Smokeless tobacco: Never  Vaping Use   Vaping Use: Never used  Substance Use Topics   Alcohol use: No   Drug use: Yes    Types: Marijuana    Home Medications Prior to Admission medications   Medication Sig Start Date End Date Taking? Authorizing Provider  albuterol (VENTOLIN HFA) 108 (90 Base) MCG/ACT inhaler Inhale 2 puffs into the lungs every 6 (six) hours as needed for wheezing or shortness of breath. Patient not taking: Reported on 04/19/2020 03/02/20   Claiborne Rigg, NP  azithromycin (ZITHROMAX) 250 MG tablet Take 1 tablet (250 mg total) by mouth daily. Take first 2 tablets together, then 1 every day until finished. 11/07/20   Roxy Horseman, PA-C  [facility] TUSSIN DM 20-200 MG/10ML liquid TAKE 10 MLS BY MOUTH EVERY 4 (FOUR) HOURS AS NEEDED FOR COUGH. 06/21/20   Hoy Register, MD  cyclobenzaprine (FLEXERIL) 5 MG tablet Take 1 tablet (5 mg total) by mouth 2 (two) times daily as needed for muscle spasms. 10/20/20   Marcine Matar, MD  gabapentin (NEURONTIN) 300 MG capsule Take 1 capsule (300 mg total) by mouth at bedtime. 11/28/20   Claiborne Rigg, NP  HYDROcodone-acetaminophen (NORCO/VICODIN) 5-325 MG tablet Take 1 tablet by mouth every 6 (six) hours as needed for severe pain. 04/19/20   Gwyneth Sprout, MD  ibuprofen (ADVIL) 800 MG tablet Take 1 tablet (800 mg total) by mouth every 8 (eight) hours as needed. 10/20/20   Marcine Matar, MD  ondansetron (ZOFRAN ODT) 4 MG disintegrating tablet Take 1 tablet (4 mg total) by mouth every 8 (eight) hours as needed for nausea or vomiting. 06/12/20   Claiborne Rigg, NP  phenazopyridine (PYRIDIUM) 200 MG tablet Take 1 tablet (200 mg total) by mouth 3 (three) times daily. 04/19/20   Gwyneth Sprout, MD  predniSONE (DELTASONE) 20 MG tablet Take 2 tablets (40 mg total) by mouth daily. Take 40 mg by mouth daily for 3 days, then 20mg  by mouth daily for 3 days, then 10mg  daily for 3 days 11/07/20   , PA-C  promethazine (PHENERGAN) 25 MG tablet Take 1 tablet (  25 mg total) by mouth every 6 (six) hours as needed for nausea or vomiting. 04/19/20   Gwyneth Sprout, MD    Allergies    Bee venom  Review of Systems   Review of Systems  Constitutional:        Per HPI, otherwise negative  HENT:         Baseline hearing deficiency  Respiratory:         Per HPI, otherwise negative  Cardiovascular:        Per HPI, otherwise negative  Gastrointestinal:  Negative for vomiting.  Endocrine:       Negative aside from HPI  Genitourinary:        Neg aside from HPI   Musculoskeletal:        Per HPI, otherwise negative  Skin: Negative.   Neurological:  Negative for syncope.   Physical Exam Updated Vital Signs BP 122/90 (BP Location: Right Arm)   Pulse (!) 104   Temp 97.6 F (36.4 C) (Oral)   Resp 15    SpO2 100%   Physical Exam Vitals and nursing note reviewed.  Constitutional:      General: She is not in acute distress.    Appearance: She is well-developed.  HENT:     Head: Normocephalic and atraumatic.  Eyes:     Conjunctiva/sclera: Conjunctivae normal.  Cardiovascular:     Rate and Rhythm: Normal rate and regular rhythm.  Pulmonary:     Effort: Pulmonary effort is normal. No respiratory distress.     Breath sounds: Normal breath sounds. No stridor.  Abdominal:     General: There is no distension.     Tenderness: There is no abdominal tenderness. There is no guarding.  Skin:    General: Skin is warm and dry.  Neurological:     Mental Status: She is alert.     Cranial Nerves: No cranial nerve deficit.     Comments: Hearing deficiency otherwise unremarkable    ED Results / Procedures / Treatments   Labs (all labs ordered are listed, but only abnormal results are displayed) Labs Reviewed  BASIC METABOLIC PANEL - Abnormal; Notable for the following components:      Result Value   Potassium 3.1 (*)    Glucose, Bld 115 (*)    All other components within normal limits  CBC - Abnormal; Notable for the following components:   Platelets 149 (*)    All other components within normal limits  RESP PANEL BY RT-PCR (FLU A&B, COVID) [id number]  TROPONIN I (HIGH SENSITIVITY)  TROPONIN I (HIGH SENSITIVITY)    EKG EKG Interpretation  Date/Time:  Thursday March 22 2021 15:21:11 EDT Ventricular Rate:  68 PR Interval:  164 QRS Duration: 86 QT Interval:  399 QTC Calculation: 425 R Axis:   84 Text Interpretation: Sinus rhythm RSR' in V1 or V2, probably normal variant Baseline wander Artifact unremarkable Confirmed by Gerhard Munch [phone]) on 03/22/2021 3:24:20 PM  Radiology DG Chest 2 View  Result Date: 03/22/2021 CLINICAL DATA:  Shortness of breath EXAM: CHEST - 2 VIEW COMPARISON:  Chest radiograph 02/07/2021 FINDINGS: Unchanged cardiomediastinal silhouette. There is no focal  airspace consolidation. There is no large pleural effusion or visible pneumothorax. There is no acute osseous abnormality. IMPRESSION: No evidence of acute cardiopulmonary disease. Electronically Signed   By: Caprice Renshaw M.D.   On: 03/22/2021 15:15    Procedures Procedures   Medications Ordered in ED Medications - No data to display  ED Course  I  have reviewed the triage vital signs and the nursing notes.  Pertinent labs & imaging results that were available during my care of the patient were reviewed by me and considered in my medical decision making (see chart for details).  Pulse ox 100% room air normal Cardiac 70 sinus normal  MDM Rules/Calculators/A&P Patient presents with cough, congestion, left-sided chest pain with coughing.  She is awake, alert, afebrile, with no hypoxia, increased work of breathing.  There is nausea, but no vomiting.  Patient's physical exam is generally reassuring, but given considerations of viral URI versus pneumonia versus influenza versus COVID, patient had labs, x-ray, EKG ordered. Patient received Toradol for pain control.   5:00 PM Influenza result positive.  Patient aware.  Given the duration of symptoms about 1 week she is not a candidate for Tamiflu.  She remains hemodynamically unremarkable, no increased work of breathing.  We discussed home care instructions, patient discharged in stable condition.  Final Clinical Impression(s) / ED Diagnoses Final diagnoses:  Influenza A     Gerhard Munch, MD 03/22/21 1617    Gerhard Munch, MD 03/22/21 1700

## 2021-03-22 NOTE — ED Provider Notes (Signed)
Emergency Medicine Provider Triage Evaluation Note  Lindsay Friedman , a 50 y.o. female  was evaluated in triage.  Pt complains of flulike symptoms x5 days.  History of COPD, now with increased dark green sputum.  Review of Systems  Positive: Cough, chest wall pain, fever, body aches, shortness of breath Negative: Abdominal pain, nausea, vomiting  Physical Exam  BP 122/90 (BP Location: Right Arm)   Pulse (!) 104   Temp 97.6 F (36.4 C) (Oral)   Resp 15   SpO2 100%  Gen:   Awake, no distress   Resp:  Normal effort  MSK:   Moves extremities without difficulty  Other:  Lungs clear to auscultation  Medical Decision Making  Medically screening exam initiated at 1:57 PM.  Appropriate orders placed.  Lindsay Friedman was informed that the remainder of the evaluation will be completed by another provider, this initial triage assessment does not replace that evaluation, and the importance of remaining in the ED until their evaluation is complete.     Jeanella Flattery 03/22/21 1357    Gerhard Munch, MD 03/22/21 [phone]

## 2021-07-16 ENCOUNTER — Encounter: Payer: Self-pay | Admitting: Nurse Practitioner

## 2021-07-16 ENCOUNTER — Ambulatory Visit (INDEPENDENT_AMBULATORY_CARE_PROVIDER_SITE_OTHER): Payer: Self-pay | Admitting: Nurse Practitioner

## 2021-07-16 ENCOUNTER — Other Ambulatory Visit: Payer: Self-pay

## 2021-07-16 VITALS — BP 121/86 | HR 77 | Temp 98.5°F | Resp 20

## 2021-07-16 DIAGNOSIS — Z1283 Encounter for screening for malignant neoplasm of skin: Secondary | ICD-10-CM | POA: Insufficient documentation

## 2021-07-16 DIAGNOSIS — F419 Anxiety disorder, unspecified: Secondary | ICD-10-CM | POA: Insufficient documentation

## 2021-07-16 DIAGNOSIS — N6324 Unspecified lump in the left breast, lower inner quadrant: Secondary | ICD-10-CM

## 2021-07-16 MED ORDER — ESCITALOPRAM OXALATE 10 MG PO TABS: 10.0000 mg | ORAL_TABLET | Freq: Every day | ORAL | 0 refills | Status: DC

## 2021-07-16 NOTE — Patient Instructions (Signed)
Anxiety:  Will order lexapro  Will schedule counseling her at the office   Breast lump:  Will order diagnostic mammogram  Patient needs routine mammogram - has never had a mammogram  Orders Placed This Encounter  Procedures   MM 3D SCREEN BREAST BILATERAL    Standing Status:   Future    Standing Expiration Date:   07/16/2022    Order Specific Question:   Reason for Exam (SYMPTOM  OR DIAGNOSIS REQUIRED)    Answer:   lump to left breast / breast cancer screening    Order Specific Question:   Preferred imaging location?    Answer:   [facility]    Order Specific Question:   Is the patient pregnant?    Answer:   No   MM Digital Diagnostic Unilat L    Standing Status:   Future    Standing Expiration Date:   07/16/2022    Order Specific Question:   Reason for Exam (SYMPTOM  OR DIAGNOSIS REQUIRED)    Answer:   lump to left breast    Order Specific Question:   Is the patient pregnant?    Answer:   No    Order Specific Question:   Preferred imaging location?    Answer:   [facility]     Follow up:  Follow up in 4 weeks with PCP for anxiety  Follow up ASAP for counseling

## 2021-07-16 NOTE — Assessment & Plan Note (Signed)
Will order lexapro  Will schedule counseling her at the office   Breast lump:  Will order diagnostic mammogram  Patient needs routine mammogram - has never had a mammogram  Orders Placed This Encounter  Procedures   MM 3D SCREEN BREAST BILATERAL    Standing Status:   Future    Standing Expiration Date:   07/16/2022    Order Specific Question:   Reason for Exam (SYMPTOM  OR DIAGNOSIS REQUIRED)    Answer:   lump to left breast / breast cancer screening    Order Specific Question:   Preferred imaging location?    Answer:   [facility]    Order Specific Question:   Is the patient pregnant?    Answer:   No   MM Digital Diagnostic Unilat L    Standing Status:   Future    Standing Expiration Date:   07/16/2022    Order Specific Question:   Reason for Exam (SYMPTOM  OR DIAGNOSIS REQUIRED)    Answer:   lump to left breast    Order Specific Question:   Is the patient pregnant?    Answer:   No    Order Specific Question:   Preferred imaging location?    Answer:   [facility]     Follow up:  Follow up in 4 weeks with PCP for anxiety  Follow up ASAP for counseling

## 2021-07-16 NOTE — Progress Notes (Signed)
@Patient  ID: , female    DOB: 1971-03-31, 51 y.o.   MRN: [id number]  Chief Complaint  Patient presents with   Breast Pain    Left breast pain    Referring provider: [id number], NP  HPI  Patient presents today with left breast pain.  Patient states that she has never had a mammogram.  This is a patient of Claiborne Rigg.  Denies any recent injury, redness, or drainage.   Patient states that she has had anxiety for the past couple of months. She states that her daughter has been missing since January. We discussed that we can start her on medication for anxiety and will get her an appointment ASAP with one of our counselors here.  Patient is requesting referral for skin cancer screening. She does have past history of skin cancer cancer and would like to be checked again.   Denies f/c/s, n/v/d, hemoptysis, PND, chest pain or edema.    Allergies  Allergen Reactions   Bee Venom Anaphylaxis     There is no immunization history on file for this patient.  Past Medical History:  Diagnosis Date   COPD (chronic obstructive pulmonary disease) (HCC)    Kidney stone    Migraines    Pneumonia    Pneumothorax     Tobacco History: Social History   Tobacco Use  Smoking Status Every Day   Packs/day: 0.50   Types: Cigarettes  Smokeless Tobacco Never   Ready to quit: Not Answered Counseling given: Not Answered   Outpatient Encounter Medications as of 07/16/2021  Medication Sig   escitalopram (LEXAPRO) 10 MG tablet Take 1 tablet (10 mg total) by mouth daily.   gabapentin (NEURONTIN) 300 MG capsule Take 1 capsule (300 mg total) by mouth at bedtime.   albuterol (VENTOLIN HFA) 108 (90 Base) MCG/ACT inhaler Inhale 2 puffs into the lungs every 6 (six) hours as needed for wheezing or shortness of breath. (Patient not taking: Reported on 04/19/2020)   azithromycin (ZITHROMAX) 250 MG tablet Take 1 tablet (250 mg total) by mouth daily. Take first 2 tablets together, then 1  every day until finished. (Patient not taking: Reported on 07/16/2021)   [employer] TUSSIN DM 20-200 MG/10ML liquid TAKE 10 MLS BY MOUTH EVERY 4 (FOUR) HOURS AS NEEDED FOR COUGH. (Patient not taking: Reported on 07/16/2021)   cyclobenzaprine (FLEXERIL) 5 MG tablet Take 1 tablet (5 mg total) by mouth 2 (two) times daily as needed for muscle spasms. (Patient not taking: Reported on 07/16/2021)   HYDROcodone-acetaminophen (NORCO/VICODIN) 5-325 MG tablet Take 1 tablet by mouth every 6 (six) hours as needed for severe pain. (Patient not taking: Reported on 07/16/2021)   ibuprofen (ADVIL) 800 MG tablet Take 1 tablet (800 mg total) by mouth every 8 (eight) hours as needed. (Patient not taking: Reported on 07/16/2021)   ondansetron (ZOFRAN ODT) 4 MG disintegrating tablet Take 1 tablet (4 mg total) by mouth every 8 (eight) hours as needed for nausea or vomiting. (Patient not taking: Reported on 07/16/2021)   phenazopyridine (PYRIDIUM) 200 MG tablet Take 1 tablet (200 mg total) by mouth 3 (three) times daily. (Patient not taking: Reported on 07/16/2021)   predniSONE (DELTASONE) 20 MG tablet Take 2 tablets (40 mg total) by mouth daily. Take 40 mg by mouth daily for 3 days, then 20mg  by mouth daily for 3 days, then 10mg  daily for 3 days (Patient not taking: Reported on 07/16/2021)   promethazine (PHENERGAN) 25 MG tablet Take 1 tablet (25 mg  total) by mouth every 6 (six) hours as needed for nausea or vomiting.   No facility-administered encounter medications on file as of 07/16/2021.     Review of Systems  Review of Systems  Constitutional: Negative.   HENT: Negative.    Cardiovascular: Negative.   Gastrointestinal: Negative.   Skin:        Lump to left breast  Allergic/Immunologic: Negative.   Neurological: Negative.   Psychiatric/Behavioral: Negative.        Physical Exam  BP 121/86    Pulse 77    Temp 98.5 F (36.9 C) (Oral)    Resp 20    SpO2 95%   Wt Readings from Last 5 Encounters:  02/07/21 105 lb 13.1  oz (48 kg)  10/20/20 107 lb 9.6 oz (48.8 kg)  08/04/20 110 lb 3.7 oz (50 kg)  07/17/20 110 lb (49.9 kg)  04/19/20 110 lb (49.9 kg)     Physical Exam Vitals and nursing note reviewed.  Constitutional:      General: She is not in acute distress.    Appearance: She is well-developed.  Cardiovascular:     Rate and Rhythm: Normal rate and regular rhythm.  Pulmonary:     Effort: Pulmonary effort is normal.     Breath sounds: Normal breath sounds.  Chest:       Comments: Lump to left breast at area noted Neurological:     Mental Status: She is alert and oriented to person, place, and time.       Assessment & Plan:   Lump in lower inner quadrant of left breast Will order lexapro  Will schedule counseling her at the office   Breast lump:  Will order diagnostic mammogram  Patient needs routine mammogram - has never had a mammogram  Orders Placed This Encounter  Procedures   MM 3D SCREEN BREAST BILATERAL    Standing Status:   Future    Standing Expiration Date:   07/16/2022    Order Specific Question:   Reason for Exam (SYMPTOM  OR DIAGNOSIS REQUIRED)    Answer:   lump to left breast / breast cancer screening    Order Specific Question:   Preferred imaging location?    Answer:   [place]    Order Specific Question:   Is the patient pregnant?    Answer:   No   MM Digital Diagnostic Unilat L    Standing Status:   Future    Standing Expiration Date:   07/16/2022    Order Specific Question:   Reason for Exam (SYMPTOM  OR DIAGNOSIS REQUIRED)    Answer:   lump to left breast    Order Specific Question:   Is the patient pregnant?    Answer:   No    Order Specific Question:   Preferred imaging location?    Answer:   [facility]     Follow up:  Follow up in 4 weeks with PCP for anxiety  Follow up ASAP for counseling     Ivonne Andrew, NP 07/16/2021

## 2021-07-16 NOTE — Progress Notes (Signed)
Left breast pain x 1   Concerns with anxiety.  States daughter has been missing since January 20

## 2021-07-17 ENCOUNTER — Ambulatory Visit (INDEPENDENT_AMBULATORY_CARE_PROVIDER_SITE_OTHER): Payer: Self-pay | Admitting: Clinical

## 2021-07-17 DIAGNOSIS — F4323 Adjustment disorder with mixed anxiety and depressed mood: Secondary | ICD-10-CM

## 2021-07-17 NOTE — BH Specialist Note (Signed)
Integrated Behavioral Health Initial In-Person Visit  MRN: [id number] Name: Lindsay Friedman  Number of Integrated Behavioral Health Clinician visits: 1- Initial Visit  Session Start time: 1030    Session End time: 1130  Total time in minutes: 60   Types of Service: Individual psychotherapy  Interpretor:No. Interpretor Name and Language: N/A   Warm Hand Off Completed.        Subjective: Lindsay Friedman is a 51 y.o. female accompanied by  self Patient was referred by Angus Seller, NP for anxiety. Patient reports the following symptoms/concerns: Reports feeling depressed, decreased interest in activities, decreased energy, self-esteem disturbances, trouble sleeping, anxiousness, excessive worrying, restlessness, and irritability. Reports that her daughter, age 33, has been missing for one month. Reports that she has not been sleeping due to worrying about her daughter. Reports that her daughter uses opioids. Reports that she is worried that her daughter is going to overdose.  Duration of problem: 1 month; Severity of problem: moderate  Objective: Mood: Anxious and Depressed and Affect: Appropriate Risk of harm to self or others: No plan to harm self or others  Life Context: Family and Social: Pt has three adult children.  School/Work: Pt is not employed.  Self-Care: Pt started back smoking cigarettes since her daughter went missing.  Life Changes: Pt's daughter has been missing for a year.   Patient and/or Family's Strengths/Protective Factors: Concrete supports in place (healthy food, safe environments, etc.)  Goals Addressed: Patient will: Reduce symptoms of: anxiety and depression Increase knowledge and/or ability of: coping skills  Demonstrate ability to: Increase healthy adjustment to current life circumstances  Progress towards Goals: Ongoing  Interventions: Interventions utilized: Mindfulness or Management consultant, CBT Cognitive Behavioral Therapy, and  Supportive Counseling  Standardized Assessments completed: Not Needed  Patient and/or Family Response: Pt receptive to tx. Pt receptive to psychoeducation on depression and anxiety. Pt receptive to cognitive restructuring to decrease negative thoughts.   Patient Centered Plan: Patient is on the following Treatment Plan(s):  Depression and anxiety  Assessment: Denies SI/HI. Patient currently experiencing depression and anxiety as a result of an adjustment. Pt is experiencing difficulty with her daughter being missing. Pt appears to feel bad due to not beng able to help her daughter.   Patient may benefit from brief interventions. LCSW provided psychoeducation on depression and anxiety. LCSW utilizing cognitive restructuring to decrease negative thoughts about herself. LCSW encouraged pt to utilize deep breathing exercises. LCSW will fu with pt.  Plan: Follow up with behavioral health clinician on : 07/31/21 Behavioral recommendations: Utilize deep breathing exercises. Referral(s): Integrated [employer] (In Clinic) "From scale of 1-10, how likely are you to follow plan?": 10  Beaux Wedemeyer C Reighlynn Swiney, LCSW

## 2021-07-18 ENCOUNTER — Other Ambulatory Visit: Payer: Self-pay | Admitting: Nurse Practitioner

## 2021-07-18 DIAGNOSIS — N6324 Unspecified lump in the left breast, lower inner quadrant: Secondary | ICD-10-CM

## 2021-07-31 ENCOUNTER — Ambulatory Visit: Payer: Medicaid Other | Admitting: Clinical

## 2021-08-11 IMAGING — CR DG CHEST 2V
2 series · 2 of 2 positions shown · non-contrast
Comparison: 01/11/2020, CT 02/15/2019

CLINICAL DATA: Short of breath

EXAM:
CHEST - 2 VIEW

[chest pa]
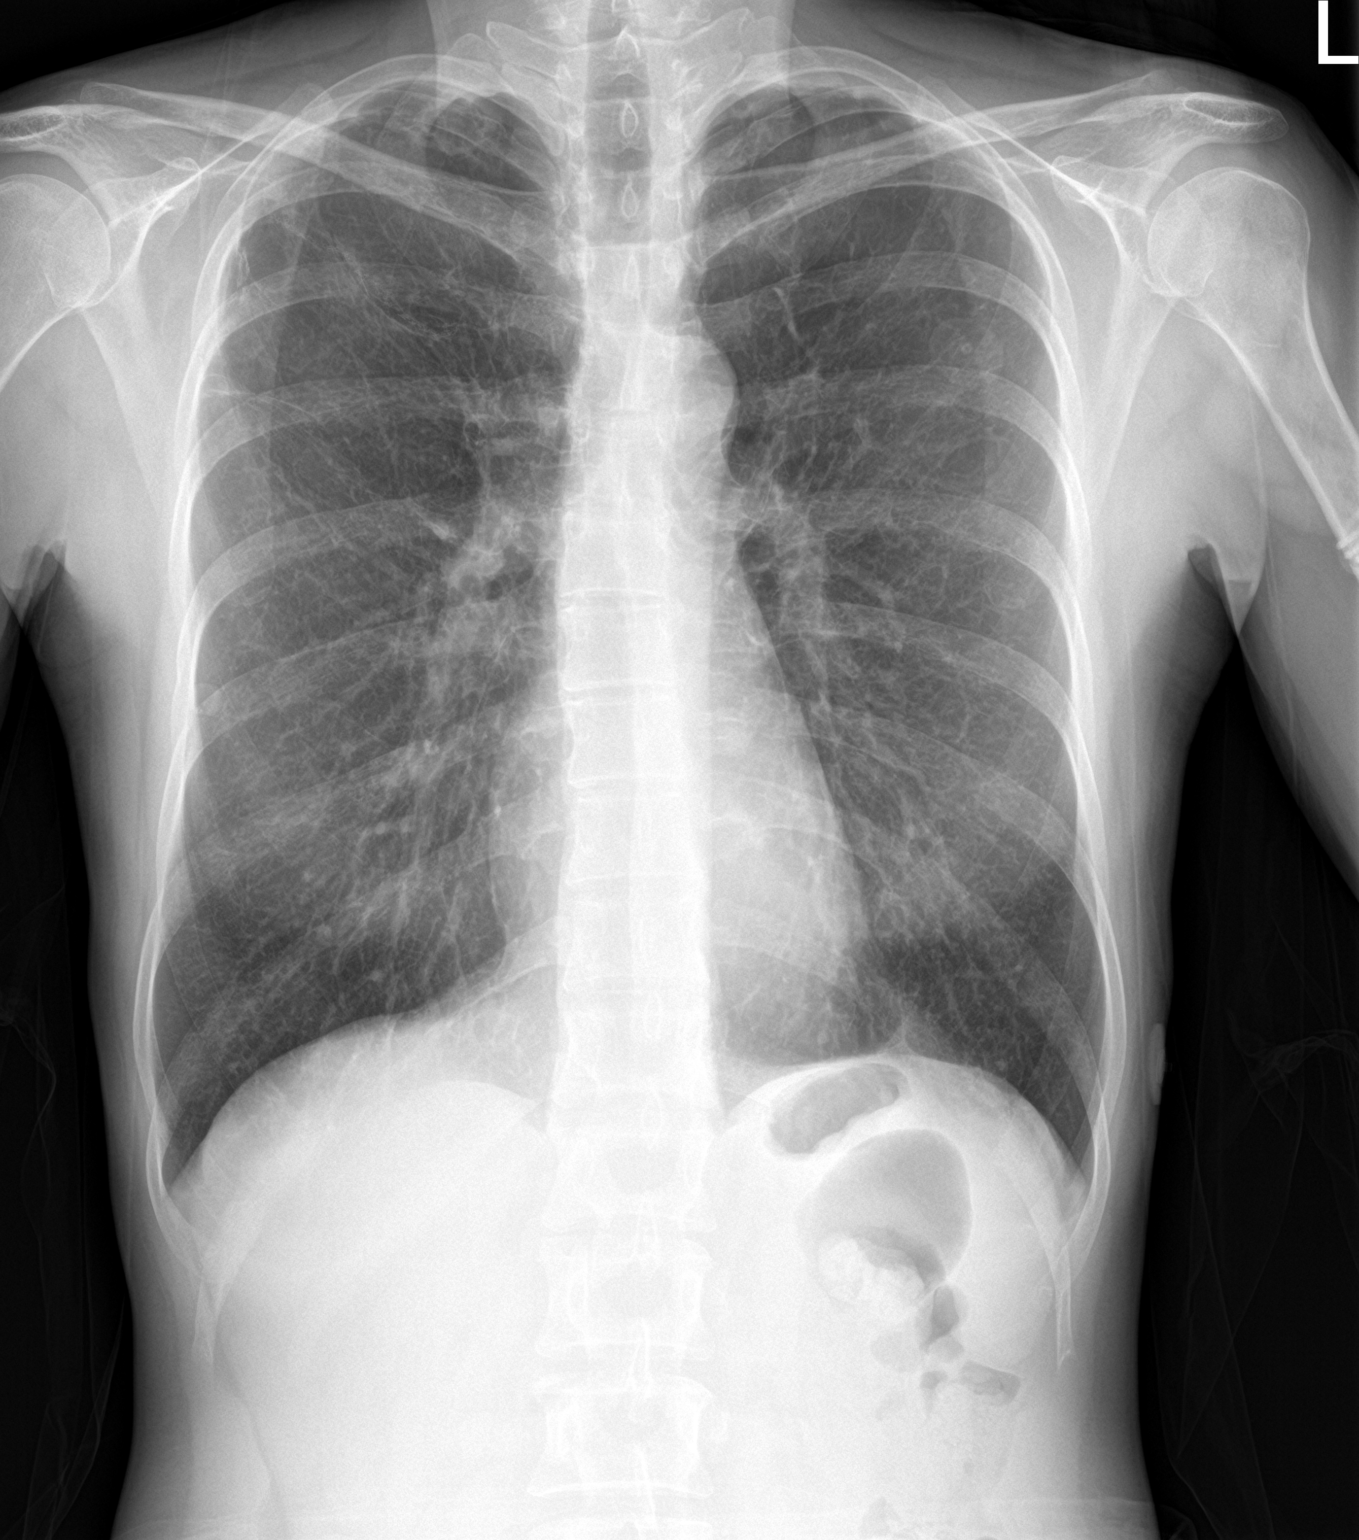

[chest lat]
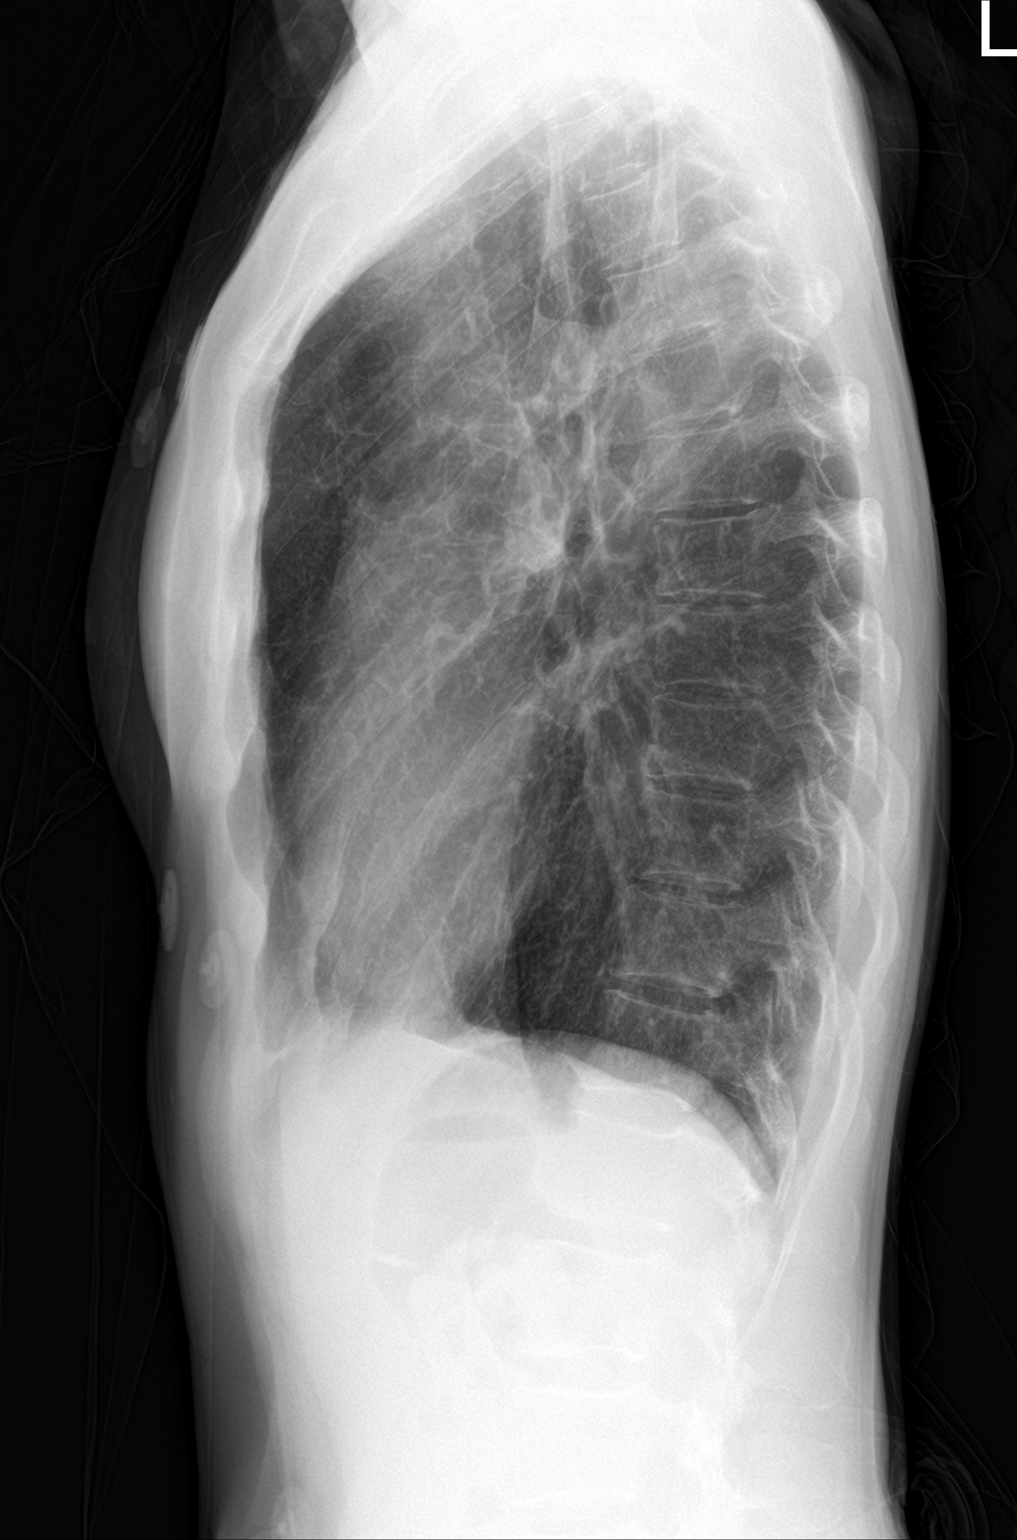

[2 of 2 positions shown; findings below may reference images not displayed]

FINDINGS: Hyperinflation with emphysematous disease. Apical scarring. No
consolidation or effusion. Normal heart size. No pneumothorax
IMPRESSION: No active cardiopulmonary disease. Hyperinflation with emphysematous
disease.

## 2021-08-13 ENCOUNTER — Telehealth: Payer: Self-pay | Admitting: Nurse Practitioner

## 2021-08-13 ENCOUNTER — Telehealth: Payer: Medicaid Other | Admitting: Nurse Practitioner

## 2021-08-13 NOTE — Telephone Encounter (Signed)
anxiety - called to change to virtual, mailbox full, sent secure text message, no response yet. 8:06am. ? ?NO answer x 1. 9:05am ? ?No answer x 2 9:26 am ?

## 2021-08-13 NOTE — Telephone Encounter (Signed)
No answer. COULD NOT LEAVE VM ?

## 2021-08-14 ENCOUNTER — Inpatient Hospital Stay: Admission: RE | Admit: 2021-08-14 | Payer: Medicaid Other | Source: Ambulatory Visit

## 2021-09-07 ENCOUNTER — Emergency Department (HOSPITAL_COMMUNITY): Payer: Self-pay

## 2021-09-07 ENCOUNTER — Inpatient Hospital Stay (HOSPITAL_COMMUNITY)
Admission: EM | Admit: 2021-09-07 | Discharge: 2021-09-08 | DRG: 395 | Disposition: A | Discharge status: Home / Self Care | Payer: Self-pay | Attending: Obstetrics and Gynecology | Admitting: Obstetrics and Gynecology

## 2021-09-07 ENCOUNTER — Encounter (HOSPITAL_COMMUNITY): Payer: Self-pay

## 2021-09-07 ENCOUNTER — Other Ambulatory Visit: Payer: Self-pay

## 2021-09-07 DIAGNOSIS — J449 Chronic obstructive pulmonary disease, unspecified: Secondary | ICD-10-CM

## 2021-09-07 DIAGNOSIS — R933 Abnormal findings on diagnostic imaging of other parts of digestive tract: Secondary | ICD-10-CM

## 2021-09-07 DIAGNOSIS — Z87442 Personal history of urinary calculi: Secondary | ICD-10-CM

## 2021-09-07 DIAGNOSIS — Z91030 Bee allergy status: Secondary | ICD-10-CM

## 2021-09-07 DIAGNOSIS — K259 Gastric ulcer, unspecified as acute or chronic, without hemorrhage or perforation: Secondary | ICD-10-CM

## 2021-09-07 DIAGNOSIS — Z90710 Acquired absence of both cervix and uterus: Secondary | ICD-10-CM

## 2021-09-07 DIAGNOSIS — K2990 Gastroduodenitis, unspecified, without bleeding: Secondary | ICD-10-CM | POA: Diagnosis present

## 2021-09-07 DIAGNOSIS — K5900 Constipation, unspecified: Secondary | ICD-10-CM | POA: Diagnosis present

## 2021-09-07 DIAGNOSIS — F17210 Nicotine dependence, cigarettes, uncomplicated: Secondary | ICD-10-CM | POA: Diagnosis present

## 2021-09-07 DIAGNOSIS — K2970 Gastritis, unspecified, without bleeding: Secondary | ICD-10-CM

## 2021-09-07 DIAGNOSIS — Z79899 Other long term (current) drug therapy: Secondary | ICD-10-CM

## 2021-09-07 DIAGNOSIS — Z8701 Personal history of pneumonia (recurrent): Secondary | ICD-10-CM

## 2021-09-07 DIAGNOSIS — Z20822 Contact with and (suspected) exposure to covid-19: Secondary | ICD-10-CM | POA: Diagnosis present

## 2021-09-07 DIAGNOSIS — F419 Anxiety disorder, unspecified: Secondary | ICD-10-CM | POA: Diagnosis present

## 2021-09-07 DIAGNOSIS — K551 Chronic vascular disorders of intestine: Principal | ICD-10-CM

## 2021-09-07 DIAGNOSIS — R1010 Upper abdominal pain, unspecified: Secondary | ICD-10-CM

## 2021-09-07 DIAGNOSIS — J439 Emphysema, unspecified: Secondary | ICD-10-CM

## 2021-09-07 DIAGNOSIS — G43909 Migraine, unspecified, not intractable, without status migrainosus: Secondary | ICD-10-CM | POA: Diagnosis present

## 2021-09-07 DIAGNOSIS — J432 Centrilobular emphysema: Secondary | ICD-10-CM | POA: Diagnosis present

## 2021-09-07 DIAGNOSIS — N200 Calculus of kidney: Secondary | ICD-10-CM

## 2021-09-07 LAB — COMPREHENSIVE METABOLIC PANEL
ALT: 13 U/L (ref 0–44)
AST: 24 U/L (ref 15–41)
Albumin: 4.3 g/dL (ref 3.5–5.0)
Alkaline Phosphatase: 71 U/L (ref 38–126)
Anion gap: 9 (ref 5–15)
BUN: 9 mg/dL (ref 6–20)
CO2: 25 mmol/L (ref 22–32)
Calcium: 9.9 mg/dL (ref 8.9–10.3)
Chloride: 105 mmol/L (ref 98–111)
Creatinine, Ser: 0.83 mg/dL (ref 0.44–1.00)
GFR, Estimated: >60 mL/min (ref >60)
Glucose, Bld: 108 mg/dL — ABNORMAL HIGH (ref 70–99)
Potassium: 4.7 mmol/L (ref 3.5–5.1)
Sodium: 139 mmol/L (ref 135–145)
Total Bilirubin: 0.6 mg/dL (ref 0.3–1.2)
Total Protein: 7.3 g/dL (ref 6.5–8.1)

## 2021-09-07 LAB — CBC
HCT: 43.6 % (ref 36.0–46.0)
Hemoglobin: 14.7 g/dL (ref 12.0–15.0)
MCH: 33.8 pg (ref 26.0–34.0)
MCHC: 33.7 g/dL (ref 30.0–36.0)
MCV: 100.2 fL — ABNORMAL HIGH (ref 80.0–100.0)
Platelets: 277 10*3/uL (ref 150–400)
RBC: 4.35 MIL/uL (ref 3.87–5.11)
RDW: 13.1 % (ref 11.5–15.5)
WBC: 10.4 10*3/uL (ref 4.0–10.5)
nRBC: 0 % (ref 0.0–0.2)

## 2021-09-07 LAB — URINALYSIS, ROUTINE W REFLEX MICROSCOPIC
Bilirubin Urine: NEGATIVE
Glucose, UA: NEGATIVE mg/dL
Hgb urine dipstick: NEGATIVE
Ketones, ur: NEGATIVE mg/dL
Leukocytes,Ua: NEGATIVE
Nitrite: NEGATIVE
Protein, ur: 30 mg/dL — AB
Specific Gravity, Urine: 1.028 (ref 1.005–1.030)
pH: 5 (ref 5.0–8.0)

## 2021-09-07 LAB — RESP PANEL BY RT-PCR (FLU A&B, COVID) ARPGX2
Influenza A by PCR: NEGATIVE
Influenza B by PCR: NEGATIVE
SARS Coronavirus 2 by RT PCR: NEGATIVE

## 2021-09-07 LAB — LIPASE, BLOOD: Lipase: 26 U/L (ref 11–51)

## 2021-09-07 MED ORDER — ONDANSETRON HCL 4 MG/2ML IJ SOLN
4.0000 mg | Freq: Four times a day (QID) | INTRAMUSCULAR | Status: DC | PRN
  Administered 2021-09-07 – 2021-09-08 (×3): 4 mg via INTRAVENOUS
  Filled 2021-09-07 (×3): qty 2

## 2021-09-07 MED ORDER — MORPHINE SULFATE (PF) 4 MG/ML IV SOLN
4.0000 mg | Freq: Once | INTRAVENOUS | Status: AC
  Administered 2021-09-07: 4 mg via INTRAVENOUS
  Filled 2021-09-07: qty 1

## 2021-09-07 MED ORDER — ONDANSETRON HCL 4 MG/2ML IJ SOLN
4.0000 mg | Freq: Once | INTRAMUSCULAR | Status: AC
Start: 2021-09-07 — End: 2021-09-07
  Administered 2021-09-07: 4 mg via INTRAVENOUS
  Filled 2021-09-07: qty 2

## 2021-09-07 MED ORDER — HYDROMORPHONE HCL 1 MG/ML IJ SOLN
0.5000 mg | INTRAMUSCULAR | Status: DC | PRN
  Filled 2021-09-07: qty 1

## 2021-09-07 MED ORDER — MORPHINE SULFATE (PF) 4 MG/ML IV SOLN
4.0000 mg | Freq: Once | INTRAVENOUS | Status: AC
Start: 2021-09-07 — End: 2021-09-07
  Administered 2021-09-07: 4 mg via INTRAVENOUS
  Filled 2021-09-07: qty 1

## 2021-09-07 MED ORDER — IOHEXOL 350 MG/ML SOLN
60.0000 mL | Freq: Once | INTRAVENOUS | Status: AC | PRN
  Administered 2021-09-07: 60 mL via INTRAVENOUS

## 2021-09-07 MED ORDER — SODIUM CHLORIDE 0.9% FLUSH: 3.0000 mL | Freq: Two times a day (BID) | INTRAVENOUS | Status: DC

## 2021-09-07 MED ORDER — ALBUTEROL SULFATE (2.5 MG/3ML) 0.083% IN NEBU: 3.0000 mL | INHALATION_SOLUTION | RESPIRATORY_TRACT | Status: DC | PRN

## 2021-09-07 MED ORDER — ENOXAPARIN SODIUM 40 MG/0.4ML IJ SOSY
40.0000 mg | PREFILLED_SYRINGE | INTRAMUSCULAR | Status: DC
  Administered 2021-09-07: 40 mg via SUBCUTANEOUS
  Filled 2021-09-07: qty 0.4

## 2021-09-07 MED ORDER — HYDROMORPHONE HCL 1 MG/ML IJ SOLN
0.5000 mg | INTRAMUSCULAR | Status: DC | PRN
  Administered 2021-09-07 – 2021-09-08 (×4): 1 mg via INTRAVENOUS
  Filled 2021-09-07 (×4): qty 1

## 2021-09-07 MED ORDER — ACETAMINOPHEN 325 MG PO TABS: 650.0000 mg | ORAL_TABLET | Freq: Four times a day (QID) | ORAL | Status: DC | PRN

## 2021-09-07 MED ORDER — ACETAMINOPHEN 650 MG RE SUPP
650.0000 mg | Freq: Four times a day (QID) | RECTAL | Status: DC | PRN
Start: 2021-09-07 — End: 2021-09-08

## 2021-09-07 MED ORDER — SODIUM CHLORIDE 0.9 % IV BOLUS
500.0000 mL | Freq: Once | INTRAVENOUS | Status: AC
  Administered 2021-09-07: 500 mL via INTRAVENOUS

## 2021-09-07 MED ORDER — SODIUM CHLORIDE 0.9 % IV SOLN: INTRAVENOUS | Status: AC

## 2021-09-07 MED ORDER — ONDANSETRON HCL 4 MG/2ML IJ SOLN
4.0000 mg | Freq: Once | INTRAMUSCULAR | Status: AC
  Administered 2021-09-07: 4 mg via INTRAVENOUS
  Filled 2021-09-07: qty 2

## 2021-09-07 NOTE — H&P (Signed)
?History and Physical  ? ?Lindsay ColonelBrandy G Burkman OZD:[id number] DOB: November 07, 1970 DOA: 09/07/2021 ? ?PCP: Claiborne RiggFleming, Zelda W, NP  ? ?Patient coming from: Home ? ?Chief Complaint: abdominal pain, nausea, vomiting ? ?HPI: Lindsay Friedman is a 51 y.o. female with medical history significant of COPD, migraines, pneumothorax, renal stones, anxiety presenting with abdominal pain. ? ?Patient reports 3 to 4 days of abdominal pain described as sharp in nature that radiates across her abdomen.  Rates the pain as a 9 out of 10 at its worst.  States the pain waxes and wanes.  Pain is worse with eating.  She also reports 2 days of associated nausea and vomiting.  And she also reports incidental dysuria as well. ? ?She denies fevers, chills, chest pain, shortness of breath, constipation, diarrhea. ? ?ED Course: Vital signs in the ED significant for blood pressure in the 90s to 120s systolic.  Lab work-up included CMP with glucose 108.  CBC within normal limits.  Lipase normal.  Respiratory panel for flu and COVID-negative.  Urinalysis positive for protein and rare bacteria only.  CT of the abdomen pelvis showed moderate dilation of the proximal duodenum with significant tapering at the level overlying the SMA which is suspicious for SMA syndrome.  No other acute abnormalities noted and emphysema also noted.  Patient received morphine and Zofran in the ED.  General surgery was consulted and recommended NG tube, n.p.o., bowel rest.  Also recommended to consider GI consult. ? ?Review of Systems: As per HPI otherwise all other systems reviewed and are negative. ? ?Past Medical History:  ?Diagnosis Date  ? COPD (chronic obstructive pulmonary disease) (HCC)   ? Kidney stone   ? Migraines   ? Pneumonia   ? Pneumothorax   ? ? ?Past Surgical History:  ?Procedure Laterality Date  ? ABDOMINAL HYSTERECTOMY    ? LUNG SURGERY    ? ? ?Social History ? reports that she has been smoking cigarettes. She has been smoking an average of .5 packs per day. She has  never used smokeless tobacco. She reports current drug use. Drug: Marijuana. She reports that she does not drink alcohol. ? ?Allergies  ?Allergen Reactions  ? Bee Venom Anaphylaxis  ? ? ?No family history on file. ? ? ?Prior to Admission medications   ?Medication Sig Start Date End Date Taking? Authorizing Provider  ?Multiple Vitamins-Minerals (CENTRUM ADULTS PO) Take 1 tablet by mouth daily.   Yes [provider]  ?naproxen sodium (ALEVE) 220 MG tablet Take 440 mg by mouth 3 (three) times daily as needed (pain).   Yes [provider]  ?Omega-3 Fatty Acids (FISH OIL PO) Take 1 capsule by mouth daily.   Yes [provider]  ?escitalopram (LEXAPRO) 10 MG tablet Take 1 tablet (10 mg total) by mouth daily. ?Patient not taking: Reported on 09/07/2021 07/16/21 10/27/21  Ivonne AndrewNichols, Tonya S, NP  ?gabapentin (NEURONTIN) 300 MG capsule Take 1 capsule (300 mg total) by mouth at bedtime. ?Patient not taking: Reported on 09/07/2021 11/28/20   Claiborne RiggFleming, Zelda W, NP  ? ? ?Physical Exam: ?Vitals:  ? 09/07/21 1545 09/07/21 1815 09/07/21 1815 09/07/21 1816  ?BP: 103/82 91/78  115/77  ?Pulse: 70 74  74  ?Resp:   16 16  ?Temp:      ?TempSrc:      ?SpO2: 97% 95%  95%  ?Weight:      ?Height:      ? ? ?Physical Exam ?Constitutional:   ?   General: She is not  in acute distress. ?   Appearance: Normal appearance.  ?HENT:  ?   Head: Normocephalic and atraumatic.  ?   Mouth/Throat:  ?   Mouth: Mucous membranes are moist.  ?   Pharynx: Oropharynx is clear.  ?Eyes:  ?   Extraocular Movements: Extraocular movements intact.  ?   Pupils: Pupils are equal, round, and reactive to light.  ?Cardiovascular:  ?   Rate and Rhythm: Normal rate and regular rhythm.  ?   Pulses: Normal pulses.  ?   Heart sounds: Normal heart sounds.  ?Pulmonary:  ?   Effort: Pulmonary effort is normal. No respiratory distress.  ?   Breath sounds: Normal breath sounds.  ?Abdominal:  ?   General: Bowel sounds are normal. There is no distension.  ?    Palpations: Abdomen is soft.  ?   Tenderness: There is abdominal tenderness.  ?Musculoskeletal:     ?   General: No swelling or deformity.  ?Skin: ?   General: Skin is warm and dry.  ?Neurological:  ?   General: No focal deficit present.  ?   Mental Status: Mental status is at baseline.  ? ?Labs on Admission: I have personally reviewed following labs and imaging studies ? ?CBC: ?Recent Labs  ?Lab 09/07/21 ?1237  ?WBC 10.4  ?HGB 14.7  ?HCT 43.6  ?MCV 100.2*  ?PLT 277  ? ? ?Basic Metabolic Panel: ?Recent Labs  ?Lab 09/07/21 ?1237  ?NA 139  ?K 4.7  ?CL 105  ?CO2 25  ?GLUCOSE 108*  ?BUN 9  ?CREATININE 0.83  ?CALCIUM 9.9  ? ? ?GFR: ?Estimated Creatinine Clearance: 61.4 mL/min (by C-G formula based on SCr of 0.83 mg/dL). ? ?Liver Function Tests: ?Recent Labs  ?Lab 09/07/21 ?1237  ?AST 24  ?ALT 13  ?ALKPHOS 71  ?BILITOT 0.6  ?PROT 7.3  ?ALBUMIN 4.3  ? ? ?Urine analysis: ?   ?Component Value Date/Time  ? COLORURINE AMBER (A) 09/07/2021 1257  ? APPEARANCEUR CLOUDY (A) 09/07/2021 1257  ? APPEARANCEUR Hazy 09/08/2013 1115  ? LABSPEC 1.028 09/07/2021 1257  ? LABSPEC 1.029 09/08/2013 1115  ? PHURINE 5.0 09/07/2021 1257  ? GLUCOSEU NEGATIVE 09/07/2021 1257  ? GLUCOSEU Negative 09/08/2013 1115  ? HGBUR NEGATIVE 09/07/2021 1257  ? BILIRUBINUR NEGATIVE 09/07/2021 1257  ? BILIRUBINUR negative 12/30/2019 1729  ? BILIRUBINUR Negative 09/08/2013 1115  ? Lavenia Atlas NEGATIVE 09/07/2021 1257  ? PROTEINUR 30 (A) 09/07/2021 1257  ? UROBILINOGEN 0.2 12/30/2019 1729  ? NITRITE NEGATIVE 09/07/2021 1257  ? LEUKOCYTESUR NEGATIVE 09/07/2021 1257  ? LEUKOCYTESUR Negative 09/08/2013 1115  ? ? ?Radiological Exams on Admission: ?CT ABDOMEN PELVIS W CONTRAST ? ?Result Date: 09/07/2021 ?CLINICAL DATA:  51 year old female left lower quadrant abdominal pain. EXAM: CT ABDOMEN AND PELVIS WITH CONTRAST TECHNIQUE: Multidetector CT imaging of the abdomen and pelvis was performed using the standard protocol following bolus administration of intravenous contrast.  RADIATION DOSE REDUCTION: This exam was performed according to the departmental dose-optimization program which includes automated exposure control, adjustment of the mA and/or kV according to patient size and/or use of iterative reconstruction technique. CONTRAST:  48mL OMNIPAQUE IOHEXOL 350 MG/ML SOLN COMPARISON:  04/19/2020 FINDINGS: Lower chest: Severe centrilobular emphysema. Heart is normal in size. Hepatobiliary: There are few scattered subcentimeter hypodensities in the left lobe liver, too small to characterize by CT, unchanged from comparison, and favored represent simple hepatic cysts. No gallstones, gallbladder wall thickening, or biliary dilatation. Pancreas: Unremarkable. No pancreatic ductal dilatation or surrounding inflammatory changes. Spleen: Normal in size without  focal abnormality. Adrenals/Urinary Tract: Adrenal glands are unremarkable. Kidneys are normal, without renal calculi, focal lesion, or hydronephrosis. Bladder is unremarkable. Stomach/Bowel: Stomach is within normal limits. Moderate dilation of the first second and proximal third portion of the duodenum which tapers abruptly at the level of the superior mesenteric artery. Appendix appears normal. No evidence of bowel wall thickening, distention, or inflammatory changes. Vascular/Lymphatic: Aortic atherosclerosis. No enlarged abdominal or pelvic lymph nodes. Reproductive: Status post hysterectomy. No adnexal masses. Other: No abdominal wall hernia or abnormality. No abdominopelvic ascites. Musculoskeletal: No acute or significant osseous findings. IMPRESSION: 1. Moderate dilation of the proximal duodenum, tapering significantly in the mid third portion at the level of the overlying superior mesenteric artery. Anatomic configuration and clinical presentation of vomiting raises suspicion for superior mesenteric artery syndrome. 2. No additional acute or significant abdominopelvic abnormality. 3. Emphysema (ICD10-J43.9). 4. Aortic  Atherosclerosis (ICD10-I70.0). Marliss Coots, MD [facility] Radiology Electronically Signed   By: Marliss Coots M.D.   On: 09/07/2021 14:46   ? ?EKG: Not yet performed.

## 2021-09-07 NOTE — ED Provider Notes (Addendum)
Care assumed from previous provider at shift change.  See note for full HPI ? ?CT scan shows superior mesenteric artery syndrome.  General surgery has been consulted for recommendations.  Plan to follow-up on recommendations ? ?Physical Exam  ?BP 104/70   Pulse 91   Temp 98.1 ?F (36.7 ?C) (Oral)   Resp 16   Ht 5\' 2"  (1.575 m)   Wt 48 kg   SpO2 97%   BMI 19.35 kg/m?  ? ?Physical Exam ?Vitals and nursing note reviewed.  ? ?Procedures  ?Procedures ?Labs Reviewed  ?COMPREHENSIVE METABOLIC PANEL - Abnormal; Notable for the following components:  ?    Result Value  ? Glucose, Bld 108 (*)   ? All other components within normal limits  ?CBC - Abnormal; Notable for the following components:  ? MCV 100.2 (*)   ? All other components within normal limits  ?URINALYSIS, ROUTINE W REFLEX MICROSCOPIC - Abnormal; Notable for the following components:  ? Color, Urine AMBER (*)   ? APPearance CLOUDY (*)   ? Protein, ur 30 (*)   ? Bacteria, UA RARE (*)   ? All other components within normal limits  ?RESP PANEL BY RT-PCR (FLU A&B, COVID) ARPGX2  ?LIPASE, BLOOD  ?HIV ANTIBODY (ROUTINE TESTING W REFLEX)  ?COMPREHENSIVE METABOLIC PANEL  ?CBC  ? CT ABDOMEN PELVIS W CONTRAST ? ?Result Date: 09/07/2021 ?CLINICAL DATA:  51 year old female left lower quadrant abdominal pain. EXAM: CT ABDOMEN AND PELVIS WITH CONTRAST TECHNIQUE: Multidetector CT imaging of the abdomen and pelvis was performed using the standard protocol following bolus administration of intravenous contrast. RADIATION DOSE REDUCTION: This exam was performed according to the departmental dose-optimization program which includes automated exposure control, adjustment of the mA and/or kV according to patient size and/or use of iterative reconstruction technique. CONTRAST:  67mL OMNIPAQUE IOHEXOL 350 MG/ML SOLN COMPARISON:  04/19/2020 FINDINGS: Lower chest: Severe centrilobular emphysema. Heart is normal in size. Hepatobiliary: There are few scattered subcentimeter  hypodensities in the left lobe liver, too small to characterize by CT, unchanged from comparison, and favored represent simple hepatic cysts. No gallstones, gallbladder wall thickening, or biliary dilatation. Pancreas: Unremarkable. No pancreatic ductal dilatation or surrounding inflammatory changes. Spleen: Normal in size without focal abnormality. Adrenals/Urinary Tract: Adrenal glands are unremarkable. Kidneys are normal, without renal calculi, focal lesion, or hydronephrosis. Bladder is unremarkable. Stomach/Bowel: Stomach is within normal limits. Moderate dilation of the first second and proximal third portion of the duodenum which tapers abruptly at the level of the superior mesenteric artery. Appendix appears normal. No evidence of bowel wall thickening, distention, or inflammatory changes. Vascular/Lymphatic: Aortic atherosclerosis. No enlarged abdominal or pelvic lymph nodes. Reproductive: Status post hysterectomy. No adnexal masses. Other: No abdominal wall hernia or abnormality. No abdominopelvic ascites. Musculoskeletal: No acute or significant osseous findings. IMPRESSION: 1. Moderate dilation of the proximal duodenum, tapering significantly in the mid third portion at the level of the overlying superior mesenteric artery. Anatomic configuration and clinical presentation of vomiting raises suspicion for superior mesenteric artery syndrome. 2. No additional acute or significant abdominopelvic abnormality. 3. Emphysema (ICD10-J43.9). 4. Aortic Atherosclerosis (ICD10-I70.0). 14/05/2019, MD [facility] Radiology Electronically Signed   By: [facility] M.D.   On: 09/07/2021 14:46    ?ED Course / MDM  ?  ? ?Care assumed from previous provider, follow-up on general surgery recommendations for questionable superior mesenteric artery syndrome. ? ?Labs and imaging personally viewed and interpreted: ? ?UA neg for infection ?CBC without leukocytosis ?CT AP  with  possible SMA syndrome  ? ?General surgery has assessed patient at bedside.  Recommend NG tube, supportive care, possible GI involvement for evaluation. Medicine Admit. ? ?CONSULT with Dr. Alinda Money with [facility] who is agreeable to evaluate patient for admission ? ?The patient appears reasonably stabilized for admission considering the current resources, flow, and capabilities available in the ED at this time, and I doubt any other [facility] requiring further screening and/or treatment in the ED prior to admission.  ? ?Medical Decision Making ?Amount and/or Complexity of Data Reviewed ?External Data Reviewed: labs, radiology and notes. ?Labs: ordered. Decision-making details documented in ED Course. ?Radiology: ordered and independent interpretation performed. Decision-making details documented in ED Course. ? ?Risk ?Prescription drug management. ?Parenteral controlled substances. ?Decision regarding hospitalization. ? ? ? ?Abd pain ?Emesis ? ? ? ?  ? ?  ?Jihan Mellette A, PA-C ?09/07/21 2328 ? ?  ?Pricilla Loveless, MD ?09/08/21 1534 ? ?

## 2021-09-07 NOTE — ED Triage Notes (Signed)
Pt c/o 9/10 sharp pain in RUQ, LUQ and RLQ of abdomen. N/Vx2days. Pt states vomited 4 times in 24 hours. Pt denies diarrhea.  ?

## 2021-09-07 NOTE — ED Provider Notes (Signed)
?[facility] ?Provider Note ? ? ?CSN: [id number] ?Arrival date & time: 09/07/21  1221 ? ?  ? ?History ? ?Chief Complaint  ?Patient presents with  ? Abdominal Pain  ? ? ?Lindsay Friedman is a [age] y.o. female. ? ?The history is provided by the patient and medical records. No language interpreter was used.  ?Abdominal Pain ? ?[age] year old female significant history of COPD, migraine, kidney stone, prior abdominal surgery including abdominal hysterectomy who presents for evaluation of abdominal pain.  For the past 3 to 4 days patient has had lower abdominal pain.  She describes a sharp sensation going across her abdomen.  She also endorsed having some urinary discomfort with decreased urination as a burning sensation.  She endorsed nausea without vomiting.  She denies having fever chills no chest pain shortness of breath no constipation or diarrhea no blood in her urine or vaginal discharge.  She has had prior hysterectomy.  But no complaint of back pain.  She did try drinking cranberry juice without adequate relief.  She endorsed decrease in appetite ? ?Home Medications ?Prior to Admission medications   ?Medication Sig Start Date End Date Taking? Authorizing Provider  ?albuterol (VENTOLIN HFA) 108 (90 Base) MCG/ACT inhaler Inhale 2 puffs into the lungs every 6 (six) hours as needed for wheezing or shortness of breath. ?Patient not taking: Reported on 04/19/2020 03/02/20   Claiborne RiggFleming, Zelda W, NP  ?azithromycin (ZITHROMAX) 250 MG tablet Take 1 tablet (250 mg total) by mouth daily. Take first 2 tablets together, then 1 every day until finished. ?Patient not taking: Reported on 07/16/2021 11/07/20   Roxy HorsemanBrowning, Robert, PA-C  ?[facility] TUSSIN DM 20-200 MG/10ML liquid TAKE 10 MLS BY MOUTH EVERY 4 (FOUR) HOURS AS NEEDED FOR COUGH. ?Patient not taking: Reported on 07/16/2021 06/21/20   Hoy RegisterNewlin, Enobong, MD  ?cyclobenzaprine (FLEXERIL) 5 MG tablet Take 1 tablet (5 mg total) by mouth 2 (two) times daily as needed  for muscle spasms. ?Patient not taking: Reported on 07/16/2021 10/20/20   Marcine MatarJohnson, Deborah B, MD  ?escitalopram (LEXAPRO) 10 MG tablet Take 1 tablet (10 mg total) by mouth daily. 07/16/21 08/15/21  Ivonne AndrewNichols, Tonya S, NP  ?gabapentin (NEURONTIN) 300 MG capsule Take 1 capsule (300 mg total) by mouth at bedtime. 11/28/20   Claiborne RiggFleming, Zelda W, NP  ?HYDROcodone-acetaminophen (NORCO/VICODIN) 5-325 MG tablet Take 1 tablet by mouth every 6 (six) hours as needed for severe pain. ?Patient not taking: Reported on 07/16/2021 04/19/20   Gwyneth SproutPlunkett, Whitney, MD  ?ibuprofen (ADVIL) 800 MG tablet Take 1 tablet (800 mg total) by mouth every 8 (eight) hours as needed. ?Patient not taking: Reported on 07/16/2021 10/20/20   Marcine MatarJohnson, Deborah B, MD  ?ondansetron (ZOFRAN ODT) 4 MG disintegrating tablet Take 1 tablet (4 mg total) by mouth every 8 (eight) hours as needed for nausea or vomiting. ?Patient not taking: Reported on 07/16/2021 06/12/20   Claiborne RiggFleming, Zelda W, NP  ?phenazopyridine (PYRIDIUM) 200 MG tablet Take 1 tablet (200 mg total) by mouth 3 (three) times daily. ?Patient not taking: Reported on 07/16/2021 04/19/20   Gwyneth SproutPlunkett, Whitney, MD  ?predniSONE (DELTASONE) 20 MG tablet Take 2 tablets (40 mg total) by mouth daily. Take 40 mg by mouth daily for 3 days, then 20mg  by mouth daily for 3 days, then 10mg  daily for 3 days ?Patient not taking: Reported on 07/16/2021 11/07/20   Roxy HorsemanBrowning, Robert, PA-C  ?promethazine (PHENERGAN) 25 MG tablet Take 1 tablet (25 mg total) by mouth every 6 (six) hours as needed  for nausea or vomiting. 04/19/20   Gwyneth Sprout, MD  ?   ? ?Allergies    ?Bee venom   ? ?Review of Systems   ?Review of Systems  ?Gastrointestinal:  Positive for abdominal pain.  ?All other systems reviewed and are negative. ? ?Physical Exam ?Updated Vital Signs ?BP (!) 125/102 (BP Location: Right Arm)   Pulse 90   Temp 98.1 ?F (36.7 ?C) (Oral)   Resp 16   Ht 5\' 2"  (1.575 m)   Wt 48 kg   SpO2 95%   BMI 19.35 kg/m?  ?Physical Exam ?Vitals and  nursing note reviewed.  ?Constitutional:   ?   General: She is not in acute distress. ?   Appearance: She is well-developed.  ?HENT:  ?   Head: Atraumatic.  ?Eyes:  ?   Conjunctiva/sclera: Conjunctivae normal.  ?Cardiovascular:  ?   Rate and Rhythm: Normal rate and regular rhythm.  ?Pulmonary:  ?   Effort: Pulmonary effort is normal.  ?Abdominal:  ?   Palpations: Abdomen is soft.  ?   Tenderness: There is generalized abdominal tenderness (Mild generalized abdominal tenderness without guarding or rebound tenderness.  No focal point tenderness.). There is no right CVA tenderness or left CVA tenderness.  ?Musculoskeletal:  ?   Cervical back: Neck supple.  ?Skin: ?   Findings: No rash.  ?Neurological:  ?   Mental Status: She is alert.  ?Psychiatric:     ?   Mood and Affect: Mood normal.  ? ? ?ED Results / Procedures / Treatments   ?Labs ?(all labs ordered are listed, but only abnormal results are displayed) ?Labs Reviewed  ?COMPREHENSIVE METABOLIC PANEL - Abnormal; Notable for the following components:  ?    Result Value  ? Glucose, Bld 108 (*)   ? All other components within normal limits  ?CBC - Abnormal; Notable for the following components:  ? MCV 100.2 (*)   ? All other components within normal limits  ?URINALYSIS, ROUTINE W REFLEX MICROSCOPIC - Abnormal; Notable for the following components:  ? Color, Urine AMBER (*)   ? APPearance CLOUDY (*)   ? Protein, ur 30 (*)   ? Bacteria, UA RARE (*)   ? All other components within normal limits  ?RESP PANEL BY RT-PCR (FLU A&B, COVID) [id number]  ?LIPASE, BLOOD  ? ? ?EKG ?None ? ?Radiology ?CT ABDOMEN PELVIS W CONTRAST ? ?Result Date: 09/07/2021 ?CLINICAL DATA:  51 year old female left lower quadrant abdominal pain. EXAM: CT ABDOMEN AND PELVIS WITH CONTRAST TECHNIQUE: Multidetector CT imaging of the abdomen and pelvis was performed using the standard protocol following bolus administration of intravenous contrast. RADIATION DOSE REDUCTION: This exam was performed according to  the departmental dose-optimization program which includes automated exposure control, adjustment of the mA and/or kV according to patient size and/or use of iterative reconstruction technique. CONTRAST:  35mL OMNIPAQUE IOHEXOL 350 MG/ML SOLN COMPARISON:  04/19/2020 FINDINGS: Lower chest: Severe centrilobular emphysema. Heart is normal in size. Hepatobiliary: There are few scattered subcentimeter hypodensities in the left lobe liver, too small to characterize by CT, unchanged from comparison, and favored represent simple hepatic cysts. No gallstones, gallbladder wall thickening, or biliary dilatation. Pancreas: Unremarkable. No pancreatic ductal dilatation or surrounding inflammatory changes. Spleen: Normal in size without focal abnormality. Adrenals/Urinary Tract: Adrenal glands are unremarkable. Kidneys are normal, without renal calculi, focal lesion, or hydronephrosis. Bladder is unremarkable. Stomach/Bowel: Stomach is within normal limits. Moderate dilation of the first second and proximal third portion of the duodenum which tapers  abruptly at the level of the superior mesenteric artery. Appendix appears normal. No evidence of bowel wall thickening, distention, or inflammatory changes. Vascular/Lymphatic: Aortic atherosclerosis. No enlarged abdominal or pelvic lymph nodes. Reproductive: Status post hysterectomy. No adnexal masses. Other: No abdominal wall hernia or abnormality. No abdominopelvic ascites. Musculoskeletal: No acute or significant osseous findings. IMPRESSION: 1. Moderate dilation of the proximal duodenum, tapering significantly in the mid third portion at the level of the overlying superior mesenteric artery. Anatomic configuration and clinical presentation of vomiting raises suspicion for superior mesenteric artery syndrome. 2. No additional acute or significant abdominopelvic abnormality. 3. Emphysema (ICD10-J43.9). 4. Aortic Atherosclerosis (ICD10-I70.0). Marliss Coots, MD [facility] Radiology Electronically Signed   By: Marliss Coots M.D.   On: 09/07/2021 14:46   ? ?Procedures ?Procedures  ? ? ?Medications Ordered in ED ?Medications  ?morphine (PF) 4 MG/M

## 2021-09-07 NOTE — Progress Notes (Signed)
TRN at bedside ?Attempted to place NGT -- pt unable to tolerate procedure-- Dr. Dwain Sarna notified -- OK to hold.  ? ?Anell Barr, RN  ?Trauma Response Nurse ?[phone] ?

## 2021-09-07 NOTE — Consult Note (Signed)
? ? ? ?Lindsay Friedman ?[birth year]  ?[id number].   ? ?Requesting MD: Criss AlvineGoldston, MD ?Chief Complaint/Reason for Consult: pSBO, possible SMA syndrome  ? ?HPI:  ?Lindsay Friedman is a 51 y/o F with PMH COPD, migraines, and kidney stones who [facility] with a chief complaint acute onset abdominal pain. Pain described as sharp, constant, starting in LUQ and radiating into the right. Started 2-3 days ago. Exacerbated by PO intake. Denies alleviating factors. Associated with nausea and nonbloody emesis. Denies similar pain in the past. Denies fever, chills, sick contacts, diarrhea. Denies a history of similar pain. She has had laparoscopic abdominal hysterectomy in the past. She does have issues with constipation at baseline with bowel movements every 2-3 days. Last BM 4 days ago.  ? ?Patient states she is a current smoker - cigarettes and marijuana. Denies other drug use. Denies regular alcohol use. Denies use of blood thinners. ? ?ROS: ?Review of Systems  ?Constitutional:  Positive for chills. Negative for fever.  ?HENT: Negative.    ?Eyes: Negative.   ?Respiratory: Negative.    ?Cardiovascular: Negative.   ?Gastrointestinal:  Positive for abdominal pain, constipation, nausea and vomiting. Negative for blood in stool and diarrhea.  ?Genitourinary: Negative.   ?Musculoskeletal: Negative.   ?Skin: Negative.   ?Neurological: Negative.   ?Endo/Heme/Allergies: Negative.   ? ?No family history on file. ? ?Past Medical History:  ?Diagnosis Date  ? COPD (chronic obstructive pulmonary disease) (HCC)   ? Kidney stone   ? Migraines   ? Pneumonia   ? Pneumothorax   ? ? ?Past Surgical History:  ?Procedure Laterality Date  ? ABDOMINAL HYSTERECTOMY    ? LUNG SURGERY    ? ? ?Social History:  reports that she has been smoking cigarettes. She has been smoking an average of .5 packs per day. She has never used smokeless tobacco. She reports current drug use. Drug: Marijuana. She reports that she does not drink  alcohol. ? ?Allergies:  ?Allergies  ?Allergen Reactions  ? Bee Venom Anaphylaxis  ? ? ?(Not in a hospital admission) ? ? ? ?Physical Exam: ?Blood pressure 104/70, pulse 91, temperature 98.1 ?F (36.7 ?C), temperature source Oral, resp. rate 16, height 5\' 2"  (1.575 m), weight 48 kg, SpO2 97 %. ?General: pleasant, WD, female who is laying in bed in NAD ?HEENT: head is normocephalic, atraumatic.  Sclera are noninjected.  Pupils equal and round. EOMs intact.  Ears and nose without any masses or lesions.  Mouth is pink and moist ?Heart: regular, rate. Palpable radial and pedal pulses bilaterally ?Lungs: Respiratory effort nonlabored on room air ?Abd: soft, ND, +BS, no masses, hernias, or organomegaly. Well healed laparoscopic incisional scars. Mild TTP without guarding or rebound across upper abdomen ?MSK: all 4 extremities are symmetrical with no cyanosis, clubbing, or edema. ?Skin: warm and dry with no masses, lesions, or rashes ?Neuro: Cranial nerves 2-12 grossly intact, sensation is normal throughout ?Psych: A&Ox3 with an appropriate affect.  ? ? ?Results for orders placed or performed during the hospital encounter of 09/07/21 (from the past 48 hour(s))  ?Lipase, blood     Status: None  ? Collection Time: 09/07/21 12:37 PM  ?Result Value Ref Range  ? Lipase 26 11 - 51 U/L  ?  Comment: Performed at [facility], [address] [place], [place] [address]  ?Comprehensive metabolic panel     Status: Abnormal  ? Collection Time: 09/07/21 12:37 PM  ?Result Value Ref Range  ? Sodium 139  135 - 145 mmol/L  ? Potassium 4.7 3.5 - 5.1 mmol/L  ? Chloride 105 98 - 111 mmol/L  ? CO2 25 22 - 32 mmol/L  ? Glucose, Bld 108 (H) 70 - 99 mg/dL  ?  Comment: Glucose reference range applies only to samples taken after fasting for at least 8 hours.  ? BUN 9 6 - 20 mg/dL  ? Creatinine, Ser 0.83 0.44 - 1.00 mg/dL  ? Calcium 9.9 8.9 - 10.3 mg/dL  ? Total Protein 7.3 6.5 - 8.1 g/dL  ? Albumin 4.3 3.5 - 5.0 g/dL  ? AST 24 15 - 41 U/L  ? ALT  13 0 - 44 U/L  ? Alkaline Phosphatase 71 38 - 126 U/L  ? Total Bilirubin 0.6 0.3 - 1.2 mg/dL  ? GFR, Estimated >60 >60 mL/min  ?  Comment: (NOTE) ?Calculated using the CKD-EPI Creatinine Equation (2021) ?  ? Anion gap 9 5 - 15  ?  Comment: Performed at [facility], [address], [place], [place] [address]  ?CBC     Status: Abnormal  ? Collection Time: 09/07/21 12:37 PM  ?Result Value Ref Range  ? WBC 10.4 4.0 - 10.5 K/uL  ? RBC 4.35 3.87 - 5.11 MIL/uL  ? Hemoglobin 14.7 12.0 - 15.0 g/dL  ? HCT 43.6 36.0 - 46.0 %  ? MCV 100.2 (H) 80.0 - 100.0 fL  ? MCH 33.8 26.0 - 34.0 pg  ? MCHC 33.7 30.0 - 36.0 g/dL  ? RDW 13.1 11.5 - 15.5 %  ? Platelets 277 150 - 400 K/uL  ? nRBC 0.0 0.0 - 0.2 %  ?  Comment: Performed at [facility], [address] [place], [place] [address]  ?Urinalysis, Routine w reflex microscopic Urine, Clean Catch     Status: Abnormal  ? Collection Time: 09/07/21 12:57 PM  ?Result Value Ref Range  ? Color, Urine AMBER (A) YELLOW  ?  Comment: BIOCHEMICALS MAY BE AFFECTED BY COLOR  ? APPearance CLOUDY (A) CLEAR  ? Specific Gravity, Urine 1.028 1.005 - 1.030  ? pH 5.0 5.0 - 8.0  ? Glucose, UA NEGATIVE NEGATIVE mg/dL  ? Hgb urine dipstick NEGATIVE NEGATIVE  ? Bilirubin Urine NEGATIVE NEGATIVE  ? Ketones, ur NEGATIVE NEGATIVE mg/dL  ? Protein, ur 30 (A) NEGATIVE mg/dL  ? Nitrite NEGATIVE NEGATIVE  ? Leukocytes,Ua NEGATIVE NEGATIVE  ? RBC / HPF 0-5 0 - 5 RBC/hpf  ? WBC, UA 0-5 0 - 5 WBC/hpf  ? Bacteria, UA RARE (A) NONE SEEN  ? Squamous Epithelial / LPF 6-10 0 - 5  ? Mucus PRESENT   ? Amorphous Crystal PRESENT   ?  Comment: Performed at [facility], [address] [place], [place] [address]  ? ?CT ABDOMEN PELVIS W CONTRAST ? ?Result Date: 09/07/2021 ?CLINICAL DATA:  51 year old female left lower quadrant abdominal pain. EXAM: CT ABDOMEN AND PELVIS WITH CONTRAST TECHNIQUE: Multidetector CT imaging of the abdomen and pelvis was performed using the standard protocol following bolus  administration of intravenous contrast. RADIATION DOSE REDUCTION: This exam was performed according to the departmental dose-optimization program which includes automated exposure control, adjustment of the mA and/or kV according to patient size and/or use of iterative reconstruction technique. CONTRAST:  56mL OMNIPAQUE IOHEXOL 350 MG/ML SOLN COMPARISON:  04/19/2020 FINDINGS: Lower chest: Severe centrilobular emphysema. Heart is normal in size. Hepatobiliary: There are few scattered subcentimeter hypodensities in the left lobe liver, too small to characterize by CT, unchanged from comparison,  and favored represent simple hepatic cysts. No gallstones, gallbladder wall thickening, or biliary dilatation. Pancreas: Unremarkable. No pancreatic ductal dilatation or surrounding inflammatory changes. Spleen: Normal in size without focal abnormality. Adrenals/Urinary Tract: Adrenal glands are unremarkable. Kidneys are normal, without renal calculi, focal lesion, or hydronephrosis. Bladder is unremarkable. Stomach/Bowel: Stomach is within normal limits. Moderate dilation of the first second and proximal third portion of the duodenum which tapers abruptly at the level of the superior mesenteric artery. Appendix appears normal. No evidence of bowel wall thickening, distention, or inflammatory changes. Vascular/Lymphatic: Aortic atherosclerosis. No enlarged abdominal or pelvic lymph nodes. Reproductive: Status post hysterectomy. No adnexal masses. Other: No abdominal wall hernia or abnormality. No abdominopelvic ascites. Musculoskeletal: No acute or significant osseous findings. IMPRESSION: 1. Moderate dilation of the proximal duodenum, tapering significantly in the mid third portion at the level of the overlying superior mesenteric artery. Anatomic configuration and clinical presentation of vomiting raises suspicion for superior mesenteric artery syndrome. 2. No additional acute or significant abdominopelvic abnormality. 3.  Emphysema (ICD10-J43.9). 4. Aortic Atherosclerosis (ICD10-I70.0). Marliss Coots, MD [facility] Radiology Electronically Signed   By: Marliss Coots M.D.   On: 09/07/2021 14:46

## 2021-09-08 ENCOUNTER — Encounter (HOSPITAL_COMMUNITY)
Admission: EM | Disposition: A | Discharge status: Home / Self Care | Payer: Self-pay | Source: Home / Self Care | Attending: Obstetrics and Gynecology

## 2021-09-08 ENCOUNTER — Inpatient Hospital Stay (HOSPITAL_COMMUNITY): Payer: Self-pay | Admitting: Anesthesiology

## 2021-09-08 DIAGNOSIS — N200 Calculus of kidney: Secondary | ICD-10-CM

## 2021-09-08 DIAGNOSIS — K2970 Gastritis, unspecified, without bleeding: Secondary | ICD-10-CM

## 2021-09-08 DIAGNOSIS — R933 Abnormal findings on diagnostic imaging of other parts of digestive tract: Secondary | ICD-10-CM

## 2021-09-08 DIAGNOSIS — F419 Anxiety disorder, unspecified: Secondary | ICD-10-CM

## 2021-09-08 DIAGNOSIS — K259 Gastric ulcer, unspecified as acute or chronic, without hemorrhage or perforation: Secondary | ICD-10-CM

## 2021-09-08 DIAGNOSIS — J449 Chronic obstructive pulmonary disease, unspecified: Secondary | ICD-10-CM

## 2021-09-08 DIAGNOSIS — R1010 Upper abdominal pain, unspecified: Secondary | ICD-10-CM

## 2021-09-08 HISTORY — PX: BIOPSY: SHX5522

## 2021-09-08 HISTORY — PX: ESOPHAGOGASTRODUODENOSCOPY (EGD) WITH PROPOFOL: SHX5813

## 2021-09-08 LAB — COMPREHENSIVE METABOLIC PANEL
ALT: 11 U/L (ref 0–44)
AST: 19 U/L (ref 15–41)
Albumin: 3.6 g/dL (ref 3.5–5.0)
Alkaline Phosphatase: 58 U/L (ref 38–126)
Anion gap: 8 (ref 5–15)
BUN: 7 mg/dL (ref 6–20)
CO2: 26 mmol/L (ref 22–32)
Calcium: 9.1 mg/dL (ref 8.9–10.3)
Chloride: 106 mmol/L (ref 98–111)
Creatinine, Ser: 0.87 mg/dL (ref 0.44–1.00)
GFR, Estimated: >60 mL/min (ref >60)
Glucose, Bld: 78 mg/dL (ref 70–99)
Potassium: 4 mmol/L (ref 3.5–5.1)
Sodium: 140 mmol/L (ref 135–145)
Total Bilirubin: 0.8 mg/dL (ref 0.3–1.2)
Total Protein: 6.1 g/dL — ABNORMAL LOW (ref 6.5–8.1)

## 2021-09-08 LAB — CBC
HCT: 39 % (ref 36.0–46.0)
Hemoglobin: 12.6 g/dL (ref 12.0–15.0)
MCH: 33.3 pg (ref 26.0–34.0)
MCHC: 32.3 g/dL (ref 30.0–36.0)
MCV: 103.2 fL — ABNORMAL HIGH (ref 80.0–100.0)
Platelets: 230 10*3/uL (ref 150–400)
RBC: 3.78 MIL/uL — ABNORMAL LOW (ref 3.87–5.11)
RDW: 13 % (ref 11.5–15.5)
WBC: 7 10*3/uL (ref 4.0–10.5)
nRBC: 0 % (ref 0.0–0.2)

## 2021-09-08 SURGERY — ESOPHAGOGASTRODUODENOSCOPY (EGD) WITH PROPOFOL
Anesthesia: Monitor Anesthesia Care

## 2021-09-08 MED ORDER — PANTOPRAZOLE SODIUM 40 MG IV SOLR
40.0000 mg | Freq: Two times a day (BID) | INTRAVENOUS | Status: DC
  Administered 2021-09-08: 40 mg via INTRAVENOUS
  Filled 2021-09-08: qty 10

## 2021-09-08 MED ORDER — LIDOCAINE 2% (20 MG/ML) 5 ML SYRINGE
INTRAMUSCULAR | Status: DC | PRN
  Administered 2021-09-08: 40 mg via INTRAVENOUS

## 2021-09-08 MED ORDER — PROPOFOL 10 MG/ML IV BOLUS
INTRAVENOUS | Status: DC | PRN
  Administered 2021-09-08 (×2): 40 mg via INTRAVENOUS
  Administered 2021-09-08: 20 mg via INTRAVENOUS
  Administered 2021-09-08 (×4): 40 mg via INTRAVENOUS
  Administered 2021-09-08: 20 mg via INTRAVENOUS

## 2021-09-08 MED ORDER — LACTATED RINGERS IV SOLN
INTRAVENOUS | Status: DC
Start: 2021-09-08 — End: 2021-09-08

## 2021-09-08 MED ORDER — PANTOPRAZOLE SODIUM 40 MG PO TBEC: 40.0000 mg | DELAYED_RELEASE_TABLET | Freq: Every day | ORAL | 1 refills | Status: DC

## 2021-09-08 MED ORDER — PHENYLEPHRINE 80 MCG/ML (10ML) SYRINGE FOR IV PUSH (FOR BLOOD PRESSURE SUPPORT)
PREFILLED_SYRINGE | INTRAVENOUS | Status: DC | PRN
  Administered 2021-09-08 (×4): 80 ug via INTRAVENOUS

## 2021-09-08 SURGICAL SUPPLY — 15 items

## 2021-09-08 NOTE — ED Notes (Signed)
ED TO INPATIENT HANDOFF REPORT ? ?ED Nurse Name and Phone #: Delice Bisonara, RN ? ?S ?Name/Age/Gender ?Lindsay Friedman ?51 y.o. ?female ?Room/Bed: 041C/041C ? ?Code Status ?  Code Status: Full Code ? ?Home/SNF/Other ?Home ?Patient oriented to: self, place, time, and situation ?Is this baseline? Yes  ? ?Triage Complete: Triage complete  ?Chief Complaint ?Superior mesenteric artery syndrome (HCC) [K55.1] ? ?Triage Note ?Pt c/o 9/10 sharp pain in RUQ, LUQ and RLQ of abdomen. N/Vx2days. Pt states vomited 4 times in 24 hours. Pt denies diarrhea.   ? ?Allergies ?Allergies  ?Allergen Reactions  ? Bee Venom Anaphylaxis  ? ? ?Level of Care/Admitting Diagnosis ?ED Disposition   ? ? ED Disposition  ?Admit  ? Condition  ?--  ? Comment  ?[facility] [[id number]] ? Level of Care: Telemetry Surgical [105] ? [facility] patient [facility] if equivalent level of care is available:: No ? Covid Evaluation: Confirmed COVID Negative ? Diagnosis: Superior mesenteric artery syndrome (HCC) [[id number]][[id number]] ? Admitting Physician: Synetta FailMELVIN, ALEXANDER B [[id number]] ? Attending Physician: Synetta FailMELVIN, ALEXANDER B [[id number]] ?  ?  ? ?  ? ? ?B ?Medical/Surgery History ?Past Medical History:  ?Diagnosis Date  ? COPD (chronic obstructive pulmonary disease) (HCC)   ? Kidney stone   ? Migraines   ? Pneumonia   ? Pneumothorax   ? ?Past Surgical History:  ?Procedure Laterality Date  ? ABDOMINAL HYSTERECTOMY    ? LUNG SURGERY    ?  ? ?A ?IV Location/Drains/Wounds ?Patient Lines/Drains/Airways Status   ? ? Active Line/Drains/Airways   ? ? Name Placement date Placement time Site Days  ? Peripheral IV 09/07/21 20 G Right Antecubital 09/07/21  1311  Antecubital  1  ? ?  ?  ? ?  ? ? ?Intake/Output Last 24 hours ? ?Intake/Output Summary (Last 24 hours) at 09/08/2021 1014 ?Last data filed at 09/07/2021 1929 ?Gross per 24 hour  ?Intake 485.72 ml  ?Output --  ?Net 485.72 ml  ? ? ?Labs/Imaging ?Results for orders placed or performed  during the hospital encounter of 09/07/21 (from the past 48 hour(s))  ?Lipase, blood     Status: None  ? Collection Time: 09/07/21 12:37 PM  ?Result Value Ref Range  ? Lipase 26 11 - 51 U/L  ?  Comment: Performed at [facility], [address] [place], [place] [id number]  ?Comprehensive metabolic panel     Status: Abnormal  ? Collection Time: 09/07/21 12:37 PM  ?Result Value Ref Range  ? Sodium 139 135 - 145 mmol/L  ? Potassium 4.7 3.5 - 5.1 mmol/L  ? Chloride 105 98 - 111 mmol/L  ? CO2 25 22 - 32 mmol/L  ? Glucose, Bld 108 (H) 70 - 99 mg/dL  ?  Comment: Glucose reference range applies only to samples taken after fasting for at least 8 hours.  ? BUN 9 6 - 20 mg/dL  ? Creatinine, Ser 0.83 0.44 - 1.00 mg/dL  ? Calcium 9.9 8.9 - 10.3 mg/dL  ? Total Protein 7.3 6.5 - 8.1 g/dL  ? Albumin 4.3 3.5 - 5.0 g/dL  ? AST 24 15 - 41 U/L  ? ALT 13 0 - 44 U/L  ? Alkaline Phosphatase 71 38 - 126 U/L  ? Total Bilirubin 0.6 0.3 - 1.2 mg/dL  ? GFR, Estimated >60 >60 mL/min  ?  Comment: (NOTE) ?Calculated using the CKD-EPI Creatinine Equation (2021) ?  ? Anion gap 9 5 - 15  ?  Comment: Performed at [facility], [address], [place], [place] [address]  ?CBC     Status: Abnormal  ? Collection Time: 09/07/21 12:37 PM  ?Result Value Ref Range  ? WBC 10.4 4.0 - 10.5 K/uL  ? RBC 4.35 3.87 - 5.11 MIL/uL  ? Hemoglobin 14.7 12.0 - 15.0 g/dL  ? HCT 43.6 36.0 - 46.0 %  ? MCV 100.2 (H) 80.0 - 100.0 fL  ? MCH 33.8 26.0 - 34.0 pg  ? MCHC 33.7 30.0 - 36.0 g/dL  ? RDW 13.1 11.5 - 15.5 %  ? Platelets 277 150 - 400 K/uL  ? nRBC 0.0 0.0 - 0.2 %  ?  Comment: Performed at [facility], [address], [place], [place] [address]  ?Urinalysis, Routine w reflex microscopic Urine, Clean Catch     Status: Abnormal  ? Collection Time: 09/07/21 12:57 PM  ?Result Value Ref Range  ? Color, Urine AMBER (A) YELLOW  ?  Comment: BIOCHEMICALS MAY BE AFFECTED BY COLOR  ? APPearance CLOUDY (A) CLEAR  ? Specific Gravity, Urine 1.028 1.005 - 1.030  ?  pH 5.0 5.0 - 8.0  ? Glucose, UA NEGATIVE NEGATIVE mg/dL  ? Hgb urine dipstick NEGATIVE NEGATIVE  ? Bilirubin Urine NEGATIVE NEGATIVE  ? Ketones, ur NEGATIVE NEGATIVE mg/dL  ? Protein, ur 30 (A) NEGATIVE mg/dL  ? Nitrite NEGATIVE NEGATIVE  ? Leukocytes,Ua NEGATIVE NEGATIVE  ? RBC / HPF 0-5 0 - 5 RBC/hpf  ? WBC, UA 0-5 0 - 5 WBC/hpf  ? Bacteria, UA RARE (A) NONE SEEN  ? Squamous Epithelial / LPF 6-10 0 - 5  ? Mucus PRESENT   ? Amorphous Crystal PRESENT   ?  Comment: Performed at [facility], [address] [place], [place] [address]  ?Resp Panel by RT-PCR (Flu A&B, Covid) Nasopharyngeal Swab     Status: None  ? Collection Time: 09/07/21  3:15 PM  ? Specimen: Nasopharyngeal Swab; Nasopharyngeal(NP) swabs in vial transport medium  ?Result Value Ref Range  ? SARS Coronavirus 2 by RT PCR NEGATIVE NEGATIVE  ?  Comment: (NOTE) ?SARS-CoV-2 target nucleic acids are NOT DETECTED. ? ?The SARS-CoV-2 RNA is generally detectable in upper respiratory ?specimens during the acute phase of infection. The lowest ?concentration of SARS-CoV-2 viral copies this assay can detect is ?138 copies/mL. A negative result does not preclude SARS-Cov-2 ?infection and should not be used as the sole basis for treatment or ?other patient management decisions. A negative result may occur with  ?improper specimen collection/handling, submission of specimen other ?than nasopharyngeal swab, presence of viral mutation(s) within the ?areas targeted by this assay, and inadequate number of viral ?copies(<138 copies/mL). A negative result must be combined with ?clinical observations, patient history, and epidemiological ?information. The expected result is Negative. ? ?Fact Sheet for Patients:  ?BloggerCourse.com ? ?Fact Sheet for Healthcare Providers:  ?SeriousBroker.it ? ?This test is no t yet approved or cleared by the [place] FDA and  ?has been authorized for detection and/or diagnosis of  SARS-CoV-2 by ?FDA under an Emergency Use Authorization (EUA). This EUA will remain  ?in effect (meaning this test can be used) for the duration of the ?COVID-19 declaration under Section 564(b)(1) of the Act, 21 ?U.S.C.section 360bbb-3(b)(1), unless the authorization is terminated  ?or revoked sooner.  ? ? ?  ? Influenza A by PCR NEGATIVE NEGATIVE  ? Influenza B by PCR NEGATIVE NEGATIVE  ?  Comment: (NOTE) ?The Xpert Xpress SARS-CoV-2/FLU/RSV plus assay is  intended as an aid ?in the diagnosis of influenza from Nasopharyngeal swab specimens and ?should not be used as a sole basis for treatment. Nasal washings and ?aspirates are unacceptable for Xpert Xpress SARS-CoV-2/FLU/RSV ?testing. ? ?Fact Sheet for Patients: ?BloggerCourse.com ? ?Fact Sheet for Healthcare Providers: ?SeriousBroker.it ? ?This test is not yet approved or cleared by the [place] FDA and ?has been authorized for detection and/or diagnosis of SARS-CoV-2 by ?FDA under an Emergency Use Authorization (EUA). This EUA will remain ?in effect (meaning this test can be used) for the duration of the ?COVID-19 declaration under Section 564(b)(1) of the Act, 21 U.S.C. ?section 360bbb-3(b)(1), unless the authorization is terminated or ?revoked. ? ?Performed at [facility], [address], [place], [place] ?[address] ?  ?Comprehensive metabolic panel     Status: Abnormal  ? Collection Time: 09/08/21  5:10 AM  ?Result Value Ref Range  ? Sodium 140 135 - 145 mmol/L  ? Potassium 4.0 3.5 - 5.1 mmol/L  ? Chloride 106 98 - 111 mmol/L  ? CO2 26 22 - 32 mmol/L  ? Glucose, Bld 78 70 - 99 mg/dL  ?  Comment: Glucose reference range applies only to samples taken after fasting for at least 8 hours.  ? BUN 7 6 - 20 mg/dL  ? Creatinine, Ser 0.87 0.44 - 1.00 mg/dL  ? Calcium 9.1 8.9 - 10.3 mg/dL  ? Total Protein 6.1 (L) 6.5 - 8.1 g/dL  ? Albumin 3.6 3.5 - 5.0 g/dL  ? AST 19 15 - 41 U/L  ? ALT 11 0 - 44 U/L  ? Alkaline  Phosphatase 58 38 - 126 U/L  ? Total Bilirubin 0.8 0.3 - 1.2 mg/dL  ? GFR, Estimated >60 >60 mL/min  ?  Comment: (NOTE) ?Calculated using the CKD-EPI Creatinine Equation (2021) ?  ? Anion gap 8 5 - 15  ?  C

## 2021-09-08 NOTE — Progress Notes (Addendum)
? ?Progress Note ? ?   ?Subjective: ?Pt reports abdominal pain and some nausea. She is passing a little flatus. Feels bloated. No vomiting.  ? ?Objective: ?Vital signs in last 24 hours: ?Temp:  [98 ?F (36.7 ?C)-98.2 ?F (36.8 ?C)] 98 ?F (36.7 ?C) (04/22 1115) ?Pulse Rate:  [55-95] 95 (04/22 1115) ?Resp:  [8-16] 13 (04/22 1115) ?BP: (91-130)/(67-86) 130/71 (04/22 1115) ?SpO2:  [92 %-98 %] 98 % (04/22 1115) ?Weight:  [47.5 kg] 47.5 kg (04/22 1115) ?Last BM Date : 09/04/21 ? ?Intake/Output from previous day: ?04/21 0701 - 04/22 0700 ?In: 485.7 [IV Piggyback:485.7] ?Out: -  ?Intake/Output this shift: ?No intake/output data recorded. ? ?PE: ?General: WD, thin female who is laying in bed in NAD ?Heart: regular, rate, and rhythm.  ?Lungs: CTAB, no wheezes, rhonchi, or rales noted.  Respiratory effort nonlabored ?Abd: soft, not ttp, mild distention, BS hypoactive  ?MS: all 4 extremities are symmetrical with no cyanosis, clubbing, or edema. ?Skin: warm and dry with no masses, lesions, or rashes ?Neuro: Cranial nerves 2-12 grossly intact, sensation is normal throughout ?Psych: A&Ox3 with an appropriate affect.  ? ? ?Lab Results:  ?Recent Labs  ?  09/07/21 ?1237 09/08/21 ?0510  ?WBC 10.4 7.0  ?HGB 14.7 12.6  ?HCT 43.6 39.0  ?PLT 277 230  ? ?BMET ?Recent Labs  ?  09/07/21 ?1237 09/08/21 ?0510  ?NA 139 140  ?K 4.7 4.0  ?CL 105 106  ?CO2 25 26  ?GLUCOSE 108* 78  ?BUN 9 7  ?CREATININE 0.83 0.87  ?CALCIUM 9.9 9.1  ? ?PT/INR ?No results for input(s): LABPROT, INR in the last 72 hours. ?CMP  ?   ?Component Value Date/Time  ? NA 140 09/08/2021 0510  ? NA 143 08/31/2019 1156  ? NA 135 (L) 09/08/2013 1128  ? K 4.0 09/08/2021 0510  ? K 3.9 09/08/2013 1128  ? CL 106 09/08/2021 0510  ? CL 102 09/08/2013 1128  ? CO2 26 09/08/2021 0510  ? CO2 31 09/08/2013 1128  ? GLUCOSE 78 09/08/2021 0510  ? GLUCOSE 95 09/08/2013 1128  ? BUN 7 09/08/2021 0510  ? BUN 10 08/31/2019 1156  ? BUN 14 09/08/2013 1128  ? CREATININE 0.87 09/08/2021 0510  ?  CREATININE 0.58 (L) 09/08/2013 1128  ? CALCIUM 9.1 09/08/2021 0510  ? CALCIUM 8.8 09/08/2013 1128  ? PROT 6.1 (L) 09/08/2021 0510  ? PROT 6.9 08/31/2019 1156  ? PROT 7.7 09/08/2013 1128  ? ALBUMIN 3.6 09/08/2021 0510  ? ALBUMIN 4.7 08/31/2019 1156  ? ALBUMIN 4.2 09/08/2013 1128  ? AST 19 09/08/2021 0510  ? AST 25 09/08/2013 1128  ? ALT 11 09/08/2021 0510  ? ALT 22 09/08/2013 1128  ? ALKPHOS 58 09/08/2021 0510  ? ALKPHOS 86 09/08/2013 1128  ? BILITOT 0.8 09/08/2021 0510  ? BILITOT <0.2 08/31/2019 1156  ? BILITOT 0.3 09/08/2013 1128  ? GFRNONAA >60 09/08/2021 0510  ? GFRNONAA >60 09/08/2013 1128  ? GFRAA >60 01/11/2020 1209  ? GFRAA >60 09/08/2013 1128  ? ?Lipase  ?   ?Component Value Date/Time  ? LIPASE 26 09/07/2021 1237  ? LIPASE 94 09/08/2013 1128  ? ? ? ? ? ?Studies/Results: ?CT ABDOMEN PELVIS W CONTRAST ? ?Result Date: 09/07/2021 ?CLINICAL DATA:  51 year old female left lower quadrant abdominal pain. EXAM: CT ABDOMEN AND PELVIS WITH CONTRAST TECHNIQUE: Multidetector CT imaging of the abdomen and pelvis was performed using the standard protocol following bolus administration of intravenous contrast. RADIATION DOSE REDUCTION: This exam was performed according to the  departmental dose-optimization program which includes automated exposure control, adjustment of the mA and/or kV according to patient size and/or use of iterative reconstruction technique. CONTRAST:  42mL OMNIPAQUE IOHEXOL 350 MG/ML SOLN COMPARISON:  04/19/2020 FINDINGS: Lower chest: Severe centrilobular emphysema. Heart is normal in size. Hepatobiliary: There are few scattered subcentimeter hypodensities in the left lobe liver, too small to characterize by CT, unchanged from comparison, and favored represent simple hepatic cysts. No gallstones, gallbladder wall thickening, or biliary dilatation. Pancreas: Unremarkable. No pancreatic ductal dilatation or surrounding inflammatory changes. Spleen: Normal in size without focal abnormality. Adrenals/Urinary  Tract: Adrenal glands are unremarkable. Kidneys are normal, without renal calculi, focal lesion, or hydronephrosis. Bladder is unremarkable. Stomach/Bowel: Stomach is within normal limits. Moderate dilation of the first second and proximal third portion of the duodenum which tapers abruptly at the level of the superior mesenteric artery. Appendix appears normal. No evidence of bowel wall thickening, distention, or inflammatory changes. Vascular/Lymphatic: Aortic atherosclerosis. No enlarged abdominal or pelvic lymph nodes. Reproductive: Status post hysterectomy. No adnexal masses. Other: No abdominal wall hernia or abnormality. No abdominopelvic ascites. Musculoskeletal: No acute or significant osseous findings. IMPRESSION: 1. Moderate dilation of the proximal duodenum, tapering significantly in the mid third portion at the level of the overlying superior mesenteric artery. Anatomic configuration and clinical presentation of vomiting raises suspicion for superior mesenteric artery syndrome. 2. No additional acute or significant abdominopelvic abnormality. 3. Emphysema (ICD10-J43.9). 4. Aortic Atherosclerosis (ICD10-I70.0). Ruthann Cancer, MD [facility] Radiology Electronically Signed   By: Ruthann Cancer M.D.   On: 09/07/2021 14:46   ? ?Anti-infectives: ?Anti-infectives (From admission, onward)  ? ? None  ? ?  ? ? ? ?Assessment/Plan ?Possible SMA syndrome ?51 y/o F who presents with acute onset abdominal pain, nausea, vomiting, and CT scan showing a possible obstruction at the level of D3. She is currently hemodynamically stable with no concerning signs of bowel ischemia or perforation. No acute surgical intervention indicated. She will need further workup of possible SMA syndrome - GI has seen this AM and is planning EGD today.  ?  ?FEN - NPO, IVF ?VTE - SCDs, ok for chemical VTE from [facility] perspective ?ID - none needed from [facility] perspective ?  ?COPD ?Migraine HA  ?  ?   ?I reviewed last 24 h vitals and pain scores, last 48 h intake and output, last 24 h labs and trends. ? LOS: 0 days  ? ? ? ? ?Norm Parcel, PA-C ?[facility] ?09/08/2021, 12:44 PM ?Please see Amion for pager number during day hours 7:00am-4:30pm ? ?

## 2021-09-08 NOTE — Op Note (Addendum)
[facility] ?Patient Name: Lindsay Friedman ?Procedure Date : 09/08/2021 ?MRN: [id number] ?Attending MD: Thornton Park MD, MD ?Date of Birth: July 23, 1970 ?CSN: [id number] ?Age: 51 ?Admit Type: Inpatient ?Procedure:                Upper GI endoscopy ?Indications:              Abdominal pain, CT scan showing a possible  ?                          obstruction at the level of D3, frequent use of BC  ?                          powder for headaches ?Providers:                Thornton Park MD, MD, Grace Isaac, RN, Primitivo Gauze  ?                          [place], Technician, [employer],  ?                          Technician ?Referring MD:              ?Medicines:                Monitored Anesthesia Care ?Complications:            No immediate complications. ?Estimated Blood Loss:     Estimated blood loss was minimal. ?Procedure:                Pre-Anesthesia Assessment: ?                          - Prior to the procedure, a History and Physical  ?                          was performed, and patient medications and  ?                          allergies were reviewed. The patient's tolerance of  ?                          previous anesthesia was also reviewed. The risks  ?                          and benefits of the procedure and the sedation  ?                          options and risks were discussed with the patient.  ?                          All questions were answered, and informed consent  ?                          was obtained. Prior Anticoagulants: The patient has  ?  taken no previous anticoagulant or antiplatelet  ?                          agents. ASA Grade Assessment: II - A patient with  ?                          mild systemic disease. After reviewing the risks  ?                          and benefits, the patient was deemed in  ?                          satisfactory condition to undergo the procedure. ?                          After obtaining informed  consent, the endoscope was  ?                          passed under direct vision. Throughout the  ?                          procedure, the patient's blood pressure, pulse, and  ?                          oxygen saturations were monitored continuously. The  ?                          [id number]) Olympus endoscope was introduced  ?                          through the mouth, and advanced to the third part  ?                          of duodenum. The upper GI endoscopy was  ?                          accomplished without difficulty. The patient  ?                          tolerated the procedure well. ?Scope In: ?Scope Out: ?Findings: ?     The examined esophagus was normal. ?     Localized mild inflammation characterized by erythema, friability,  ?     granularity and linear erosions was found in the gastric body and in the  ?     gastric antrum. Biopsies were taken from the antrum, body, and fundus  ?     with a cold forceps for histology. Estimated blood loss was minimal. ?     One non-bleeding superficial gastric ulcer with no stigmata of bleeding  ?     was found in the mid-gastric body. The lesion was 4 mm in largest  ?     dimension. Biopsies were taken with a cold forceps for histology.  ?     Estimated blood loss was minimal. ?     The examined duodenum was normal to the level of the 3rd  portion of the  ?     duodenum. Biopsies were taken with a cold forceps for histology.  ?     Estimated blood loss was minimal. ?     The cardia and gastric fundus were normal on retroflexion. ?     The exam was otherwise without abnormality. ?Impression:               - Normal esophagus. ?                          - Gastritis. Biopsied. ?                          - Non-bleeding gastric ulcer with no stigmata of  ?                          bleeding. Biopsied. ?                          - Normal examined duodenum. Biopsied. ?                          - The examination was otherwise normal. ?Recommendation:           -  Patient has a contact number available for  ?                          emergencies. The signs and symptoms of potential  ?                          delayed complications were discussed with the  ?                          patient. Return to normal activities tomorrow.  ?                          Written discharge instructions were provided to the  ?                          patient. ?                          - Advance diet as tolerated. ?                          - Continue present medications including  ?                          pantoprazole 40 mg BID. ?                          - Await pathology results. ?                          - No aspirin, ibuprofen, naproxen, or other  ?                          non-steroidal anti-inflammatory drugs. ?                          -  Consider alternative management for headaches  ?                          instead of regular BC use. ?                          - Consider repeat EGD in 8-10 weeks to document  ?                          healing. Recommend concurrent colonoscopy for colon  ?                          cancer screening. ?Procedure Code(s):        --- Professional --- ?                          [phone], Esophagogastroduodenoscopy, flexible,  ?                          transoral; with biopsy, single or multiple ?Diagnosis Code(s):        --- Professional --- ?                          K29.70, Gastritis, unspecified, without bleeding ?                          K25.9, Gastric ulcer, unspecified as acute or  ?                          chronic, without hemorrhage or perforation ?                          R10.9, Unspecified abdominal pain ?CPT copyright 2019 [employer]. All rights reserved. ?The codes documented in this report are preliminary and upon coder review may  ?be revised to meet current compliance requirements. ?Thornton Park MD, MD ?09/08/2021 3:52:59 PM ?This report has been signed electronically. ?Number of Addenda: 0 ?

## 2021-09-08 NOTE — Consult Note (Signed)
? ? ?Consultation ? ?Referring Provider: TRH/Wouk ?Primary Care Physician:  Claiborne RiggFleming, Zelda W, NP ?Primary Gastroenterologist: None unassigned ? ?Reason for Consultation: Abdominal pain ? ?HPI: Lindsay Friedman is a 51 y.o. female, who presented to the emergency room yesterday after acute onset of upper abdominal pain 4 to 5 days ago.  She is describes the pain as sharp and located in the right upper quadrant and epigastrium without radiation, pain has been constant and fairly intense, she has had some associated nausea vomiting, no hematemesis.  No fever or chills.  No melena or hematochezia. ? ?Work-up in the emergency room with CT of the abdomen pelvis revealed severe COPD, there are a few hypodensities in the liver, too small to characterize but favored to represent cysts, ?Is moderate dilation of the second and third portion of duodenum which tapers abruptly at the SMA, and therefore raises question of SMA syndrome. ? ?Labs on admission WBC 7.0/hemoglobin 12.6/hematocrit 31 ?LFTs within normal limits ?BUN 7/creatinine 0.87, UA negative, respiratory panel negative ?Repeat labs today all stable ? ?Patient has history of COPD, nephrolithiasis, migraine headaches, and is status post hysterectomy. ? ?She says the pain is a little bit better today but is taking pain medication regularly.  She has not had any similar pain in the past. ?No prior GI evaluation. ?She has been taking at least 3 BC powders on a daily basis for headaches primarily and also takes Advil intermittently for pain and headaches.  No EtOH. ? ? ?Past Medical History:  ?Diagnosis Date  ? COPD (chronic obstructive pulmonary disease) (HCC)   ? Kidney stone   ? Migraines   ? Pneumonia   ? Pneumothorax   ? ? ?Past Surgical History:  ?Procedure Laterality Date  ? ABDOMINAL HYSTERECTOMY    ? LUNG SURGERY    ? ? ?Prior to Admission medications   ?Medication Sig Start Date End Date Taking? Authorizing Provider  ?Multiple Vitamins-Minerals (CENTRUM ADULTS PO)  Take 1 tablet by mouth daily.   Yes [provider]  ?naproxen sodium (ALEVE) 220 MG tablet Take 440 mg by mouth 3 (three) times daily as needed (pain).   Yes [provider]  ?Omega-3 Fatty Acids (FISH OIL PO) Take 1 capsule by mouth daily.   Yes [provider]  ?escitalopram (LEXAPRO) 10 MG tablet Take 1 tablet (10 mg total) by mouth daily. ?Patient not taking: Reported on 09/07/2021 07/16/21 10/27/21  Ivonne AndrewNichols, Tonya S, NP  ?gabapentin (NEURONTIN) 300 MG capsule Take 1 capsule (300 mg total) by mouth at bedtime. ?Patient not taking: Reported on 09/07/2021 11/28/20   Claiborne RiggFleming, Zelda W, NP  ? ? ?Current Facility-Administered Medications  ?Medication Dose Route Frequency Provider Last Rate Last Admin  ? acetaminophen (TYLENOL) tablet 650 mg  650 mg Oral Q6H PRN Synetta FailMelvin, Alexander B, MD      ? Or  ? acetaminophen (TYLENOL) suppository 650 mg  650 mg Rectal Q6H PRN Synetta FailMelvin, Alexander B, MD      ? albuterol (PROVENTIL) (2.5 MG/3ML) 0.083% nebulizer solution 3 mL  3 mL Inhalation Q4H PRN Synetta FailMelvin, Alexander B, MD      ? enoxaparin (LOVENOX) injection 40 mg  40 mg Subcutaneous Q24H Synetta FailMelvin, Alexander B, MD   40 mg at 09/07/21 1849  ? HYDROmorphone (DILAUDID) injection 0.5-1 mg  0.5-1 mg Intravenous Q4H PRN Ulis RiasBeldon, Brianna S, RPH   1 mg at 09/08/21 1003  ? ondansetron (ZOFRAN) injection 4 mg  4 mg Intravenous Q6H PRN Synetta FailMelvin, Alexander B, MD  4 mg at 09/08/21 0448  ? sodium chloride flush (NS) 0.9 % injection 3 mL  3 mL Intravenous Q12H Synetta Fail, MD      ? ?Current Outpatient Medications  ?Medication Sig Dispense Refill  ? Multiple Vitamins-Minerals (CENTRUM ADULTS PO) Take 1 tablet by mouth daily.    ? naproxen sodium (ALEVE) 220 MG tablet Take 440 mg by mouth 3 (three) times daily as needed (pain).    ? Omega-3 Fatty Acids (FISH OIL PO) Take 1 capsule by mouth daily.    ? escitalopram (LEXAPRO) 10 MG tablet Take 1 tablet (10 mg total) by mouth daily. (Patient not taking: Reported on 09/07/2021)  30 tablet 0  ? gabapentin (NEURONTIN) 300 MG capsule Take 1 capsule (300 mg total) by mouth at bedtime. (Patient not taking: Reported on 09/07/2021) 30 capsule 0  ? ? ?Allergies as of 09/07/2021 - Review Complete 09/07/2021  ?Allergen Reaction Noted  ? Bee venom Anaphylaxis 01/27/2018  ? ? ?No family history on file. ? ?Social History  ? ?Socioeconomic History  ? Marital status: Divorced  ?  Spouse name: Not on file  ? Number of children: Not on file  ? Years of education: Not on file  ? Highest education level: Not on file  ?Occupational History  ? Not on file  ?Tobacco Use  ? Smoking status: Every Day  ?  Packs/day: 0.50  ?  Types: Cigarettes  ? Smokeless tobacco: Never  ?Vaping Use  ? Vaping Use: Never used  ?Substance and Sexual Activity  ? Alcohol use: No  ? Drug use: Yes  ?  Types: Marijuana  ? Sexual activity: Yes  ?Other Topics Concern  ? Not on file  ?Social History Narrative  ? Not on file  ? ?Social Determinants of Health  ? ?Financial Resource Strain: Not on file  ?Food Insecurity: Not on file  ?Transportation Needs: Not on file  ?Physical Activity: Not on file  ?Stress: Not on file  ?Social Connections: Not on file  ?Intimate Partner Violence: Not on file  ? ? ?Review of Systems: ?Pertinent positive and negative review of systems were noted in the above HPI section.  All other review of systems was otherwise negative.  ? ?Physical Exam: ?Vital signs in last 24 hours: ?Temp:  [98.1 ?F (36.7 ?C)-98.2 ?F (36.8 ?C)] 98.2 ?F (36.8 ?C) (04/21 1940) ?Pulse Rate:  [55-91] 63 (04/22 0800) ?Resp:  [8-16] 13 (04/22 0800) ?BP: (91-125)/(67-102) 92/69 (04/22 0800) ?SpO2:  [92 %-98 %] 94 % (04/22 0800) ?Weight:  [48 kg] 48 kg (04/21 1231) ?  ?General:   Alert,  Well-developed, thin white female ,pleasant and cooperative in NAD ?Head:  Normocephalic and atraumatic.-Somewhat hard of hearing ?Eyes:  Sclera clear, no icterus.   Conjunctiva pink. ?Ears:  Normal auditory acuity. ?Nose:  No deformity, discharge,  or  lesions. ?Mouth:  No deformity or lesions.   ?Neck:  Supple; no masses or thyromegaly. ?Lungs:  Clear throughout to auscultation.   Decreased breath sounds bilaterally ? heart:  Regular rate and rhythm; no murmurs, clicks, rubs,  or gallops. ?Abdomen:  Soft, she was tender in the epigastrium and right upper quadrant, some guarding no rebound BS active,nonpalp mass or hsm.   ?Rectal: Not done ?Msk:  Symmetrical without gross deformities. Marland Kitchen ?Pulses:  Normal pulses noted. ?Extremities:  Without clubbing or edema. ?Neurologic:  Alert and  oriented x4;  grossly normal neurologically. ?Skin:  Intact without significant lesions or rashes.Marland Kitchen ?Psych:  Alert and cooperative. Normal mood  and affect. ? ?Intake/Output from previous day: ?04/21 0701 - 04/22 0700 ?In: 485.7 [IV Piggyback:485.7] ?Out: -  ?Intake/Output this shift: ?No intake/output data recorded. ? ?Lab Results: ?Recent Labs  ?  09/07/21 ?1237 09/08/21 ?0510  ?WBC 10.4 7.0  ?HGB 14.7 12.6  ?HCT 43.6 39.0  ?PLT 277 230  ? ?BMET ?Recent Labs  ?  09/07/21 ?1237 09/08/21 ?0510  ?NA 139 140  ?K 4.7 4.0  ?CL 105 106  ?CO2 25 26  ?GLUCOSE 108* 78  ?BUN 9 7  ?CREATININE 0.83 0.87  ?CALCIUM 9.9 9.1  ? ?LFT ?Recent Labs  ?  09/08/21 ?0510  ?PROT 6.1*  ?ALBUMIN 3.6  ?AST 19  ?ALT 11  ?ALKPHOS 58  ?BILITOT 0.8  ? ?PT/INR ?No results for input(s): LABPROT, INR in the last 72 hours. ?Hepatitis Panel ?No results for input(s): HEPBSAG, HCVAB, HEPAIGM, HEPBIGM in the last 72 hours. ? ? ?IMPRESSION:  ?#75 51 year old white female with 4 to 5-day history of constant sharp epigastric/right upper quadrant pain, associated with nausea, couple of episodes of vomiting. ?No prior GI history. ?Work-up in the emergency room with labs and CT, unrevealing with the exception of finding of moderate dilation of the second and third portion of the duodenum, tapering abruptly at the SMA raising a question of an SMA syndrome. ? ?Patient does take daily BC powders and frequent Advil, increasing risk  for peptic ulcer disease.  Needs further evaluation to rule out peptic ulcer disease, acute gastropathy etc. ? ?#2 chronic headaches/migraines ?#3.  Severe COPD ?#4 status post hysterectomy ?#5 history of uret

## 2021-09-08 NOTE — Progress Notes (Signed)
?PROGRESS NOTE ? ? ? ?Lindsay Friedman  LYY:[id number] DOB: Jun 07, 1970 DOA: 09/07/2021 ?PCP: Claiborne Rigg, NP  ?Outpatient Specialists: none ? ? ? ?Brief Narrative:  ? ?From admission h and p ? Lindsay Friedman is a 51 y.o. female with medical history significant of COPD, migraines, pneumothorax, renal stones, anxiety presenting with abdominal pain. ? ?Patient reports 3 to 4 days of abdominal pain described as sharp in nature that radiates across her abdomen.  Rates the pain as a 9 out of 10 at its worst.  States the pain waxes and wanes.  Pain is worse with eating.  She also reports 2 days of associated nausea and vomiting.  And she also reports incidental dysuria as well. ? ?She denies fevers, chills, chest pain, shortness of breath, constipation, diarrhea. ? ? ?Assessment & Plan: ?  ?Principal Problem: ?  Superior mesenteric artery syndrome (HCC) ?Active Problems: ?  COPD (chronic obstructive pulmonary disease) (HCC) ?  Nephrolithiasis ? ?# Abdominal pain ?1 year of progressively worsening intermittent b/l upper abdominal pain worse with eating fatty meals. Occasional BC powders at home, otherwise no toxins. Has not been evaluated previously. Here CT scan unremarkable save for findings consistent w/ possible SMA syndrome. Gen surg has seen and advises no surgical intervention. Symptomatically patient is improved, does not appear obstructed. I have consulted GI regarding questionable SMA syndrome and for further evaluation. She is afebrile and labs here (cmp, lipace, urinalysis, cbc, troponin) are unrevealing.  ?- maintain NPO ?- GI to see ? ? ?DVT prophylaxis: lovenox ?Code Status: full ?Family Communication: none @ bedside ? ?Level of care: Telemetry Surgical ?Status is: Observation ?Will message UR about patient status ? ? ? ?Consultants:  ?GI ? ?Procedures: ?EGD pending ? ?Antimicrobials:  ?none  ? ? ?Subjective: ?This morning upper abd pain persists but has improved. Has appetite, no vomiting. No  diarrhea ? ?Objective: ?Vitals:  ? 09/08/21 0615 09/08/21 0800 09/08/21 1005 09/08/21 1115  ?BP: 114/67 92/69 107/71 130/71  ?Pulse: 69 63 94 95  ?Resp: 14 13 12 13   ?Temp:   98.2 ?F (36.8 ?C) 98 ?F (36.7 ?C)  ?TempSrc:   Oral Oral  ?SpO2: 93% 94% 97% 98%  ?Weight:    47.5 kg  ?Height:    5\' 2"  (1.575 m)  ? ? ?Intake/Output Summary (Last 24 hours) at 09/08/2021 1336 ?Last data filed at 09/07/2021 1929 ?Gross per 24 hour  ?Intake 485.72 ml  ?Output --  ?Net 485.72 ml  ? ?Filed Weights  ? 09/07/21 1231 09/08/21 1115  ?Weight: 48 kg 47.5 kg  ? ? ?Examination: ? ?General exam: Appears calm and comfortable  ?Respiratory system: Clear to auscultation. Respiratory effort normal. ?Cardiovascular system: S1 & S2 heard, RRR. No JVD, murmurs, rubs, gallops or clicks. No pedal edema. ?Gastrointestinal system: Abdomen is nondistended, soft and mildly tender in the upper portions. No organomegaly or masses felt. Normal bowel sounds heard. ?Central nervous system: Alert and oriented. No focal neurological deficits. ?Extremities: Symmetric 5 x 5 power. ?Skin: No rashes, lesions or ulcers ?Psychiatry: Judgement and insight appear normal. Mood & affect appropriate.  ? ? ? ?Data Reviewed: I have personally reviewed following labs and imaging studies ? ?CBC: ?Recent Labs  ?Lab 09/07/21 ?1237 09/08/21 ?0510  ?WBC 10.4 7.0  ?HGB 14.7 12.6  ?HCT 43.6 39.0  ?MCV 100.2* 103.2*  ?PLT 277 230  ? ?Basic Metabolic Panel: ?Recent Labs  ?Lab 09/07/21 ?1237 09/08/21 ?0510  ?NA 139 140  ?K 4.7 4.0  ?CL  105 106  ?CO2 25 26  ?GLUCOSE 108* 78  ?BUN 9 7  ?CREATININE 0.83 0.87  ?CALCIUM 9.9 9.1  ? ?GFR: ?Estimated Creatinine Clearance: 58 mL/min (by C-G formula based on SCr of 0.87 mg/dL). ?Liver Function Tests: ?Recent Labs  ?Lab 09/07/21 ?1237 09/08/21 ?0510  ?AST 24 19  ?ALT 13 11  ?ALKPHOS 71 58  ?BILITOT 0.6 0.8  ?PROT 7.3 6.1*  ?ALBUMIN 4.3 3.6  ? ?Recent Labs  ?Lab 09/07/21 ?1237  ?LIPASE 26  ? ?No results for input(s): AMMONIA in the last 168  hours. ?Coagulation Profile: ?No results for input(s): INR, PROTIME in the last 168 hours. ?Cardiac Enzymes: ?No results for input(s): CKTOTAL, CKMB, CKMBINDEX, TROPONINI in the last 168 hours. ?BNP (last 3 results) ?No results for input(s): PROBNP in the last 8760 hours. ?HbA1C: ?No results for input(s): HGBA1C in the last 72 hours. ?CBG: ?No results for input(s): GLUCAP in the last 168 hours. ?Lipid Profile: ?No results for input(s): CHOL, HDL, LDLCALC, TRIG, CHOLHDL, LDLDIRECT in the last 72 hours. ?Thyroid Function Tests: ?No results for input(s): TSH, T4TOTAL, FREET4, T3FREE, THYROIDAB in the last 72 hours. ?Anemia Panel: ?No results for input(s): VITAMINB12, FOLATE, FERRITIN, TIBC, IRON, RETICCTPCT in the last 72 hours. ?Urine analysis: ?   ?Component Value Date/Time  ? COLORURINE AMBER (A) 09/07/2021 1257  ? APPEARANCEUR CLOUDY (A) 09/07/2021 1257  ? APPEARANCEUR Hazy 09/08/2013 1115  ? LABSPEC 1.028 09/07/2021 1257  ? LABSPEC 1.029 09/08/2013 1115  ? PHURINE 5.0 09/07/2021 1257  ? GLUCOSEU NEGATIVE 09/07/2021 1257  ? GLUCOSEU Negative 09/08/2013 1115  ? HGBUR NEGATIVE 09/07/2021 1257  ? BILIRUBINUR NEGATIVE 09/07/2021 1257  ? BILIRUBINUR negative 12/30/2019 1729  ? BILIRUBINUR Negative 09/08/2013 1115  ? Lavenia AtlasKETONESUR NEGATIVE 09/07/2021 1257  ? PROTEINUR 30 (A) 09/07/2021 1257  ? UROBILINOGEN 0.2 12/30/2019 1729  ? NITRITE NEGATIVE 09/07/2021 1257  ? LEUKOCYTESUR NEGATIVE 09/07/2021 1257  ? LEUKOCYTESUR Negative 09/08/2013 1115  ? ?Sepsis Labs: ?@LABRCNTIP (procalcitonin:4,lacticidven:4) ? ?) ?Recent Results (from the past 240 hour(s))  ?Resp Panel by RT-PCR (Flu A&B, Covid) Nasopharyngeal Swab     Status: None  ? Collection Time: 09/07/21  3:15 PM  ? Specimen: Nasopharyngeal Swab; Nasopharyngeal(NP) swabs in vial transport medium  ?Result Value Ref Range Status  ? SARS Coronavirus 2 by RT PCR NEGATIVE NEGATIVE Final  ?  Comment: (NOTE) ?SARS-CoV-2 target nucleic acids are NOT DETECTED. ? ?The SARS-CoV-2 RNA is  generally detectable in upper respiratory ?specimens during the acute phase of infection. The lowest ?concentration of SARS-CoV-2 viral copies this assay can detect is ?138 copies/mL. A negative result does not preclude SARS-Cov-2 ?infection and should not be used as the sole basis for treatment or ?other patient management decisions. A negative result may occur with  ?improper specimen collection/handling, submission of specimen other ?than nasopharyngeal swab, presence of viral mutation(s) within the ?areas targeted by this assay, and inadequate number of viral ?copies(<138 copies/mL). A negative result must be combined with ?clinical observations, patient history, and epidemiological ?information. The expected result is Negative. ? ?Fact Sheet for Patients:  ?BloggerCourse.comhttps://www.fda.gov/media/152166/download ? ?Fact Sheet for Healthcare Providers:  ?SeriousBroker.ithttps://www.fda.gov/media/152162/download ? ?This test is no t yet approved or cleared by the [place] FDA and  ?has been authorized for detection and/or diagnosis of SARS-CoV-2 by ?FDA under an Emergency Use Authorization (EUA). This EUA will remain  ?in effect (meaning this test can be used) for the duration of the ?COVID-19 declaration under Section 564(b)(1) of the Act, 21 ?U.S.C.section 360bbb-3(b)(1), unless the  authorization is terminated  ?or revoked sooner.  ? ? ?  ? Influenza A by PCR NEGATIVE NEGATIVE Final  ? Influenza B by PCR NEGATIVE NEGATIVE Final  ?  Comment: (NOTE) ?The Xpert Xpress SARS-CoV-2/FLU/RSV plus assay is intended as an aid ?in the diagnosis of influenza from Nasopharyngeal swab specimens and ?should not be used as a sole basis for treatment. Nasal washings and ?aspirates are unacceptable for Xpert Xpress SARS-CoV-2/FLU/RSV ?testing. ? ?Fact Sheet for Patients: ?BloggerCourse.com ? ?Fact Sheet for Healthcare Providers: ?SeriousBroker.it ? ?This test is not yet approved or cleared by the [place] FDA and ?has been authorized for detection and/or diagnosis of SARS-CoV-2 by ?FDA under an Emergency Use Authorization (EUA). This EUA will remain ?in effect (meaning this test can be used) for the duration of t

## 2021-09-08 NOTE — Anesthesia Postprocedure Evaluation (Signed)
Anesthesia Post Note ? ?Patient: Lindsay Friedman ? ?Procedure(s) Performed: ESOPHAGOGASTRODUODENOSCOPY (EGD) WITH PROPOFOL ?BIOPSY ? ?  ? ?Patient location during evaluation: PACU ?Anesthesia Type: MAC ?Level of consciousness: awake ?Pain management: pain level controlled ?Vital Signs Assessment: post-procedure vital signs reviewed and stable ?Respiratory status: spontaneous breathing, nonlabored ventilation, respiratory function stable and patient connected to nasal cannula oxygen ?Cardiovascular status: stable and blood pressure returned to baseline ?Postop Assessment: no apparent nausea or vomiting ?Anesthetic complications: no ? ? ?No notable events documented. ? ?Last Vitals:  ?Vitals:  ? 09/08/21 1551 09/08/21 1601  ?BP: (!) 111/51 118/65  ?Pulse: 84 63  ?Resp: 19 13  ?Temp:    ?SpO2: 100% 99%  ?  ?Last Pain:  ?Vitals:  ? 09/08/21 1601  ?TempSrc:   ?PainSc: 0-No pain  ? ? ?  ?  ?  ?  ?  ?  ? ?Cledis Sohn P Cyrstal Leitz ? ? ? ? ?

## 2021-09-08 NOTE — Anesthesia Procedure Notes (Signed)
Procedure Name: Harman ?Date/Time: 09/08/2021 3:25 PM ?Performed by: Renato Shin, CRNA ?Pre-anesthesia Checklist: Patient identified, Emergency Drugs available, Suction available and Patient being monitored ?Patient Re-evaluated:Patient Re-evaluated prior to induction ?Oxygen Delivery Method: Nasal cannula ?Preoxygenation: Pre-oxygenation with 100% oxygen ?Induction Type: IV induction ?Airway Equipment and Method: Bite block ?Placement Confirmation: positive ETCO2 and breath sounds checked- equal and bilateral ?Dental Injury: Teeth and Oropharynx as per pre-operative assessment  ? ? ? ? ?

## 2021-09-08 NOTE — Transfer of Care (Signed)
Immediate Anesthesia Transfer of Care Note ? ?Patient: Lindsay Friedman ? ?Procedure(s) Performed: ESOPHAGOGASTRODUODENOSCOPY (EGD) WITH PROPOFOL ?BIOPSY ? ?Patient Location: PACU ? ?Anesthesia Type:MAC ? ?Level of Consciousness: drowsy and patient cooperative ? ?Airway & Oxygen Therapy: Patient Spontanous Breathing and Patient connected to nasal cannula oxygen ? ?Post-op Assessment: Report given to RN and Post -op Vital signs reviewed and stable ? ?Post vital signs: Reviewed and stable ? ?Last Vitals:  ?Vitals Value Taken Time  ?BP 123/71 09/08/21 1541  ?Temp    ?Pulse 74 09/08/21 1542  ?Resp 16 09/08/21 1542  ?SpO2 100 % 09/08/21 1542  ?Vitals shown include unvalidated device data. ? ?Last Pain:  ?Vitals:  ? 09/08/21 1541  ?TempSrc:   ?PainSc: 0-No pain  ?   ? ?Patients Stated Pain Goal: 0 (09/08/21 1426) ? ?Complications: No notable events documented. ?

## 2021-09-08 NOTE — Discharge Summary (Signed)
Lindsay ColonelBrandy G Stembridge ZOX:[id number] DOB: [birth year] DOA: 09/07/2021 ? ?PCP: Claiborne RiggFleming, Zelda W, NP ? ?Admit date: 09/07/2021 ?Discharge date: 09/08/2021 ? ?Time spent: 45 minutes ? ?Recommendations for Outpatient Follow-up:  ?Gi f/u 2 months ?Pcp f/u ?F/u EGD biopsy results  ? ? ? ?Discharge Diagnoses:  ?Principal Problem: ?  Superior mesenteric artery syndrome (HCC) ?Active Problems: ?  COPD (chronic obstructive pulmonary disease) (HCC) ?  Nephrolithiasis ?  Pain of upper abdomen ?  Abnormal CT scan, gastrointestinal tract ?  Gastric ulcer without hemorrhage or perforation ?  Gastritis and gastroduodenitis ? ? ?Discharge Condition: stable ? ?Diet recommendation: regular ? ?Filed Weights  ? 09/07/21 1231 09/08/21 1115  ?Weight: 48 kg 47.5 kg  ? ? ?History of present illness:  ?From admission h and p ?Lindsay ColonelBrandy G Friedman is a 51 y.o. female with medical history significant of COPD, migraines, pneumothorax, renal stones, anxiety presenting with abdominal pain. ? ?Patient reports 3 to 4 days of abdominal pain described as sharp in nature that radiates across her abdomen.  Rates the pain as a 9 out of 10 at its worst.  States the pain waxes and wanes.  Pain is worse with eating.  She also reports 2 days of associated nausea and vomiting.  And she also reports incidental dysuria as well. ? ?She denies fevers, chills, chest pain, shortness of breath, constipation, diarrhea. ? ?Hospital Course:  ?Patient presented with one month of worsening intermittent upper abdominal pain associated with fatty foods. CT imaging showed signs possible SMA syndrome. Patient was evaluated by gen surg who advised no operative intervention. Patient was evaluated by GI who performed EGD which showed gastritis and non-bleeding gastric ulcer. This in the setting of regular BC powder use for headache. Patient's gastritis and gastric ulcer are the likely causes of her pain. She was advised to stop all nsaid use and to start BID PPI. She should f/u with GI in about  2 months. Biopsies from EGD are pending and should be followed up. ? ?Procedures: ?EGD  ? ?Consultations: ?GI, gen surg ? ?Discharge Exam: ?Vitals:  ? 09/08/21 1551 09/08/21 1601  ?BP: (!) 111/51 118/65  ?Pulse: 84 63  ?Resp: 19 13  ?Temp:    ?SpO2: 100% 99%  ? ? ?General: NAD ?Cardiovascular: RRR ?Respiratory: CTAB ?Abdomen: Soft, mild diffuse ttp, no rebound, normal bowel sounds ? ?Discharge Instructions ? ? ?Discharge Instructions   ? ? Diet - low sodium heart healthy   Complete by: As directed ?  ? Increase activity slowly   Complete by: As directed ?  ? ?  ? ?Allergies as of 09/08/2021   ? ?   Reactions  ? Bee Venom Anaphylaxis  ? ?  ? ?  ?Medication List  ?  ? ?STOP taking these medications   ? ?CENTRUM ADULTS PO ?  ?escitalopram 10 MG tablet ?Commonly known as: Lexapro ?  ?FISH OIL PO ?  ?gabapentin 300 MG capsule ?Commonly known as: NEURONTIN ?  ?naproxen sodium 220 MG tablet ?Commonly known as: ALEVE ?  ? ?  ? ?TAKE these medications   ? ?pantoprazole 40 MG tablet ?Commonly known as: Protonix ?Take 1 tablet (40 mg total) by mouth daily. ?  ? ?  ? ?Allergies  ?Allergen Reactions  ? Bee Venom Anaphylaxis  ? ? Follow-up Information   ? ? Claiborne RiggFleming, Zelda W, NP Follow up.   ?Specialty: Nurse Practitioner ?Contact information: ?[address] ?[place] [address] ?[phone] ? ? ?  ?  ? ?  Tressia Danas, MD Follow up in 2 month(s).   ?Specialty: Gastroenterology ?Contact information: ?[address] ?[place] [address] ?[phone] ? ? ?  ?  ? ?  ?  ? ?  ? ? ? ?The results of significant diagnostics from this hospitalization (including imaging, microbiology, ancillary and laboratory) are listed below for reference.   ? ?Significant Diagnostic Studies: ?CT ABDOMEN PELVIS W CONTRAST ? ?Result Date: 09/07/2021 ?CLINICAL DATA:  51 year old female left lower quadrant abdominal pain. EXAM: CT ABDOMEN AND PELVIS WITH CONTRAST TECHNIQUE: Multidetector CT imaging of the abdomen and pelvis was performed using the  standard protocol following bolus administration of intravenous contrast. RADIATION DOSE REDUCTION: This exam was performed according to the departmental dose-optimization program which includes automated exposure control, adjustment of the mA and/or kV according to patient size and/or use of iterative reconstruction technique. CONTRAST:  70mL OMNIPAQUE IOHEXOL 350 MG/ML SOLN COMPARISON:  04/19/2020 FINDINGS: Lower chest: Severe centrilobular emphysema. Heart is normal in size. Hepatobiliary: There are few scattered subcentimeter hypodensities in the left lobe liver, too small to characterize by CT, unchanged from comparison, and favored represent simple hepatic cysts. No gallstones, gallbladder wall thickening, or biliary dilatation. Pancreas: Unremarkable. No pancreatic ductal dilatation or surrounding inflammatory changes. Spleen: Normal in size without focal abnormality. Adrenals/Urinary Tract: Adrenal glands are unremarkable. Kidneys are normal, without renal calculi, focal lesion, or hydronephrosis. Bladder is unremarkable. Stomach/Bowel: Stomach is within normal limits. Moderate dilation of the first second and proximal third portion of the duodenum which tapers abruptly at the level of the superior mesenteric artery. Appendix appears normal. No evidence of bowel wall thickening, distention, or inflammatory changes. Vascular/Lymphatic: Aortic atherosclerosis. No enlarged abdominal or pelvic lymph nodes. Reproductive: Status post hysterectomy. No adnexal masses. Other: No abdominal wall hernia or abnormality. No abdominopelvic ascites. Musculoskeletal: No acute or significant osseous findings. IMPRESSION: 1. Moderate dilation of the proximal duodenum, tapering significantly in the mid third portion at the level of the overlying superior mesenteric artery. Anatomic configuration and clinical presentation of vomiting raises suspicion for superior mesenteric artery syndrome. 2. No additional acute or significant  abdominopelvic abnormality. 3. Emphysema (ICD10-J43.9). 4. Aortic Atherosclerosis (ICD10-I70.0). Marliss Coots, MD [facility] Radiology Electronically Signed   By: Marliss Coots M.D.   On: 09/07/2021 14:46   ? ?Microbiology: ?Recent Results (from the past 240 hour(s))  ?Resp Panel by RT-PCR (Flu A&B, Covid) Nasopharyngeal Swab     Status: None  ? Collection Time: 09/07/21  3:15 PM  ? Specimen: Nasopharyngeal Swab; Nasopharyngeal(NP) swabs in vial transport medium  ?Result Value Ref Range Status  ? SARS Coronavirus 2 by RT PCR NEGATIVE NEGATIVE Final  ?  Comment: (NOTE) ?SARS-CoV-2 target nucleic acids are NOT DETECTED. ? ?The SARS-CoV-2 RNA is generally detectable in upper respiratory ?specimens during the acute phase of infection. The lowest ?concentration of SARS-CoV-2 viral copies this assay can detect is ?138 copies/mL. A negative result does not preclude SARS-Cov-2 ?infection and should not be used as the sole basis for treatment or ?other patient management decisions. A negative result may occur with  ?improper specimen collection/handling, submission of specimen other ?than nasopharyngeal swab, presence of viral mutation(s) within the ?areas targeted by this assay, and inadequate number of viral ?copies(<138 copies/mL). A negative result must be combined with ?clinical observations, patient history, and epidemiological ?information. The expected result is Negative. ? ?Fact Sheet for Patients:  ?BloggerCourse.com ? ?Fact Sheet for Healthcare Providers:  ?SeriousBroker.it ? ?This test is no t yet  approved or cleared by the [place] and  ?has been authorized for detection and/or diagnosis of SARS-CoV-2 by ?FDA under an Emergency Use Authorization (EUA). This EUA will remain  ?in effect (meaning this test can be used) for the duration of the ?COVID-19 declaration under Section 564(b)(1) of the Act,  21 ?U.S.C.section 360bbb-3(b)(1), unless the authorization is terminated  ?or revoked sooner.  ? ? ?  ? Influenza A by PCR NEGATIVE NEGATIVE Final  ? Influenza B by PCR NEGATIVE NEGATIVE Final  ?  Comment: (NO

## 2021-09-08 NOTE — Progress Notes (Signed)
Discharge paperwork reviewed with pt. Pt alert and oriented x4 in no acute distress upon discharge. Pt has taken all of belongings with. Pt's friend awaiting downstairs to transport pt home via private vehicle. Pt wheeled downstairs via wheelchair by NT.  ?

## 2021-09-08 NOTE — Progress Notes (Signed)
Pt arrived to floor around 1115 via stretcher by ED tech. Pt ambulated from stretcher to bed with steady gait. VSS. Respirations even and unlabored on room air. Pt oriented to room. Pt encouraged to use call bell for assistance. Call bell within reach. Bed in low position.  ?

## 2021-09-08 NOTE — Anesthesia Preprocedure Evaluation (Addendum)
Anesthesia Evaluation  ?Patient identified by MRN, date of birth, ID band ?Patient awake ? ? ? ?Reviewed: ?Allergy & Precautions, NPO status , Patient's Chart, lab work & pertinent test results ? ?Airway ?Mallampati: II ? ?TM Distance: >3 FB ?Neck ROM: Full ? ? ? Dental ?no notable dental hx. ? ?  ?Pulmonary ?COPD,  COPD inhaler, Current Smoker and Patient abstained from smoking.,  ?  ?Pulmonary exam normal ? ? ? ? ? ? ? Cardiovascular ?negative cardio ROS ?Normal cardiovascular exam ? ? ?  ?Neuro/Psych ? Headaches, Anxiety   ? GI/Hepatic ?negative GI ROS, (+)  ?  ? substance abuse ? ,   ?Endo/Other  ?negative endocrine ROS ? Renal/GU ?negative Renal ROS  ? ?  ?Musculoskeletal ?negative musculoskeletal ROS ?(+)  ? Abdominal ?  ?Peds ? Hematology ?negative hematology ROS ?(+)   ?Anesthesia Other Findings ?Epigastric pain ? Reproductive/Obstetrics ? ?  ? ? ? ? ? ? ? ? ? ? ? ? ? ?  ?  ? ? ? ? ? ? ? ?Anesthesia Physical ?Anesthesia Plan ? ?ASA: 3 ? ?Anesthesia Plan: MAC  ? ?Post-op Pain Management:   ? ?Induction: Intravenous ? ?PONV Risk Score and Plan: 1 and Propofol infusion and Treatment may vary due to age or medical condition ? ?Airway Management Planned: Nasal Cannula ? ?Additional Equipment:  ? ?Intra-op Plan:  ? ?Post-operative Plan:  ? ?Informed Consent: I have reviewed the patients History and Physical, chart, labs and discussed the procedure including the risks, benefits and alternatives for the proposed anesthesia with the patient or authorized representative who has indicated his/her understanding and acceptance.  ? ? ? ?Dental advisory given ? ?Plan Discussed with: CRNA ? ?Anesthesia Plan Comments:   ? ? ? ? ? ?Anesthesia Quick Evaluation ? ?

## 2021-09-09 LAB — URINE CULTURE: Culture: NO GROWTH

## 2021-09-10 ENCOUNTER — Telehealth: Payer: Self-pay

## 2021-09-10 NOTE — Telephone Encounter (Signed)
Transition Care Management Follow-up Telephone Call ?Date of discharge and from where: 09/08/2021, [facility]  ?How have you been since you were released from the hospital? She said she is weak, " hurting but getting better." ?Any questions or concerns? Yes - She is requesting medication for a yeast infection.  ? ?Items Reviewed: ?Did the pt receive and understand the discharge instructions provided? Yes  ?Medications obtained and verified? Yes , she only has 1 medication  ?Other? No  ?Any new allergies since your discharge? No  ?Dietary orders reviewed? Yes - she is tolerating clear liquids.  ?Do you have support at home? Yes  ? ?Home Care and Equipment/Supplies: ?Were home health services ordered? no ?If so, what is the name of the agency? N/a  ?Has the agency set up a time to come to the patient's home? not applicable ?Were any new equipment or medical supplies ordered?  No ?What is the name of the medical supply agency? N/a ?Were you able to get the supplies/equipment? not applicable ?Do you have any questions related to the use of the equipment or supplies? No ? ?Functional Questionnaire: (I = Independent and D = Dependent) ?ADLs: independent ? ?Follow up appointments reviewed: ? ?PCP Hospital f/u appt confirmed? Yes  Scheduled to see Bertram Denver, NP - 09/11/2021  ?Specialist Hospital f/u appt confirmed?  GI not scheduled yet.    ?Are transportation arrangements needed? No  ?If their condition worsens, is the pt aware to call PCP or go to the Emergency Dept.? Yes ?Was the patient provided with contact information for the PCP's office or ED? Yes ?Was to pt encouraged to call back with questions or concerns? Yes ? ?

## 2021-09-11 ENCOUNTER — Encounter (HOSPITAL_COMMUNITY): Payer: Self-pay | Admitting: Gastroenterology

## 2021-09-11 ENCOUNTER — Encounter: Payer: Self-pay | Admitting: Gastroenterology

## 2021-09-11 ENCOUNTER — Ambulatory Visit: Payer: Medicaid Other | Attending: Nurse Practitioner | Admitting: Nurse Practitioner

## 2021-09-11 DIAGNOSIS — Z09 Encounter for follow-up examination after completed treatment for conditions other than malignant neoplasm: Secondary | ICD-10-CM

## 2021-09-11 DIAGNOSIS — K2900 Acute gastritis without bleeding: Secondary | ICD-10-CM

## 2021-09-11 DIAGNOSIS — B3731 Acute candidiasis of vulva and vagina: Secondary | ICD-10-CM

## 2021-09-11 LAB — SURGICAL PATHOLOGY

## 2021-09-11 MED ORDER — FLUCONAZOLE 150 MG PO TABS: 150.0000 mg | ORAL_TABLET | Freq: Once | ORAL | 0 refills | Status: AC

## 2021-09-11 NOTE — Progress Notes (Signed)
Virtual Visit via Telephone Note ? I discussed the limitations, risks, security and privacy concerns of performing an evaluation and management service by telephone and the availability of in person appointments. I also discussed with the patient that there may be a patient responsible charge related to this service. The patient expressed understanding and agreed to proceed.  ? ? ?I connected with Lindsay Friedman on 09/11/21  at  10:30 AM EDT  EDT by telephone and verified that I am speaking with the correct person using two identifiers. ? ?Location of Patient: ?Private Residence ?  ?Location of Provider: ?[employer] and [employer]  ?  ?Persons participating in Telemedicine visit: ?Bertram Denver FNP-BC ?Lindsay Friedman  ?  ?History of Present Illness: ?Telemedicine visit for: HFU ? ?Past medical history significant for COPD, migraines, pneumothorax, nephrolithiasis and anxiety. ? ?Ms. Gemmill was admitted to the hospital on 421 and discharged on 09/08/2021.  She was evaluated for abdominal pain with onset 1 month prior to arrival.  CT showed signs of SMA syndrome however general surgery advised no operative intervention.  Patient was evaluated by GI and EGD was performed which showed gastritis and nonbleeding gastric ulcer due to history of regular BC powder use for headaches.  She was advised to stop all NSAID use and start PPI twice daily and follow-up with GI in 2 months.  Biopsies from EGD were negative aside from confirming gastritis. ? ?Today she reports she is feeling better however she broke out with a generalized rash after starting pantoprazole.  It is also noted that GI recommended pantoprazole BID however patient was discharged home on pantoprazole once daily. As of today she has stopped taking the pantoprazole due to the generalized rash.  As it has not been a full 24 hours she will contact me tomorrow through MyChart to let me know if the rash has completely resolved after not taking  pantoprazole for 48 hours.  Until then she is to take over-the-counter Tums which she already has at home. ? ? ?She does endorse vaginal itching and irritation with increased white discharge.  Requesting Diflucan prescription. ? ?Past Medical History:  ?Diagnosis Date  ? COPD (chronic obstructive pulmonary disease) (HCC)   ? Kidney stone   ? Migraines   ? Pneumonia   ? Pneumothorax   ?  ?Past Surgical History:  ?Procedure Laterality Date  ? ABDOMINAL HYSTERECTOMY    ? BIOPSY  09/08/2021  ? Procedure: BIOPSY;  Surgeon: Tressia Danas, MD;  [facility] END[facility]COPY;  Service: Gastroenterology;;  ? ESOPHAGOGASTRODUODENOSCOPY (EGD) WITH PROPOFOL N/A 09/08/2021  ? Procedure: ESOPHAGOGASTRODUODENOSCOPY (EGD) WITH PROPOFOL;  Surgeon: Tressia Danas, MD;  Location[facility] ENDOSCOPY;  Service: Gastroenterology;  Laterality: N/A;  ? LUNG SURGERY    ?  ?History reviewed. No pertinent family history.  ?Social History  ? ?Socioeconomic History  ? Marital status: Divorced  ?  Spouse name: Not on file  ? Number of children: Not on file  ? Years of education: Not on file  ? Highest education level: Not on file  ?Occupational History  ? Not on file  ?Tobacco Use  ? Smoking status: Every Day  ?  Packs/day: 0.50  ?  Types: Cigarettes  ? Smokeless tobacco: Never  ?Vaping Use  ? Vaping Use: Never used  ?Substance and Sexual Activity  ? Alcohol use: No  ? Drug use: Yes  ?  Types: Marijuana  ? Sexual activity: Yes  ?Other Topics Concern  ? Not on file  ?Social History  Narrative  ? Not on file  ? ?Social Determinants of Health  ? ?Financial Resource Strain: Not on file  ?Food Insecurity: Not on file  ?Transportation Needs: Not on file  ?Physical Activity: Not on file  ?Stress: Not on file  ?Social Connections: Not on file  ?  ? ?Observations/Objective: ?Awake, alert and oriented x 3 ? ? ?Review of Systems  ?Constitutional:  Negative for fever, malaise/fatigue and weight loss.  ?HENT: Negative.  Negative for nosebleeds.   ?Eyes: Negative.   Negative for blurred vision, double vision and photophobia.  ?Respiratory: Negative.  Negative for cough and shortness of breath.   ?Cardiovascular: Negative.  Negative for chest pain, palpitations and leg swelling.  ?Gastrointestinal: Negative.  Negative for heartburn, nausea and vomiting.  ?Musculoskeletal: Negative.  Negative for myalgias.  ?Skin:  Positive for itching and rash.  ?Neurological: Negative.  Negative for dizziness, focal weakness, seizures and headaches.  ?Psychiatric/Behavioral: Negative.  Negative for suicidal ideas.    ?Assessment and Plan: ?Diagnoses and all orders for this visit: ? ?Hospital discharge follow-up ? ?Yeast vaginitis ?-     fluconazole (DIFLUCAN) 150 MG tablet; Take 1 tablet (150 mg total) by mouth once for 1 dose. If no improvement may take second dose on day 3 ? ?Acute gastritis without hemorrhage, unspecified gastritis type ?PPI currently on hold. ?May need to resume famotidine instead ?  ? ?Follow Up Instructions ?Return if symptoms worsen or fail to improve.  ? ?  ?I discussed the assessment and treatment plan with the patient. The patient was provided an opportunity to ask questions and all were answered. The patient agreed with the plan and demonstrated an understanding of the instructions. ?  ?The patient was advised to call back or seek an in-person evaluation if the symptoms worsen or if the condition fails to improve as anticipated. ? ?I provided 12 minutes of non-face-to-face time during this encounter including median intraservice time, reviewing previous notes, labs, imaging, medications and explaining diagnosis and management. ? ?Claiborne Rigg, FNP-BC  ?

## 2021-09-18 ENCOUNTER — Encounter: Payer: Self-pay | Admitting: Nurse Practitioner

## 2021-09-19 ENCOUNTER — Other Ambulatory Visit: Payer: Self-pay | Admitting: Nurse Practitioner

## 2021-09-19 MED ORDER — ONDANSETRON 4 MG PO TBDP: 4.0000 mg | ORAL_TABLET | Freq: Three times a day (TID) | ORAL | 1 refills | Status: AC | PRN

## 2021-12-12 ENCOUNTER — Encounter: Payer: Self-pay | Admitting: Nurse Practitioner

## 2021-12-12 ENCOUNTER — Other Ambulatory Visit: Payer: Self-pay

## 2021-12-12 ENCOUNTER — Ambulatory Visit: Payer: Medicaid Other | Attending: Nurse Practitioner | Admitting: Nurse Practitioner

## 2021-12-12 VITALS — BP 105/74 | HR 97 | Temp 98.2°F | Resp 16 | Wt 110.6 lb

## 2021-12-12 DIAGNOSIS — M5432 Sciatica, left side: Secondary | ICD-10-CM | POA: Insufficient documentation

## 2021-12-12 DIAGNOSIS — K2970 Gastritis, unspecified, without bleeding: Secondary | ICD-10-CM | POA: Insufficient documentation

## 2021-12-12 DIAGNOSIS — Z79899 Other long term (current) drug therapy: Secondary | ICD-10-CM | POA: Insufficient documentation

## 2021-12-12 DIAGNOSIS — E785 Hyperlipidemia, unspecified: Secondary | ICD-10-CM | POA: Insufficient documentation

## 2021-12-12 DIAGNOSIS — F419 Anxiety disorder, unspecified: Secondary | ICD-10-CM | POA: Insufficient documentation

## 2021-12-12 DIAGNOSIS — Z1211 Encounter for screening for malignant neoplasm of colon: Secondary | ICD-10-CM

## 2021-12-12 DIAGNOSIS — K2990 Gastroduodenitis, unspecified, without bleeding: Secondary | ICD-10-CM | POA: Insufficient documentation

## 2021-12-12 DIAGNOSIS — D72820 Lymphocytosis (symptomatic): Secondary | ICD-10-CM

## 2021-12-12 DIAGNOSIS — R195 Other fecal abnormalities: Secondary | ICD-10-CM | POA: Insufficient documentation

## 2021-12-12 DIAGNOSIS — R590 Localized enlarged lymph nodes: Secondary | ICD-10-CM

## 2021-12-12 DIAGNOSIS — R591 Generalized enlarged lymph nodes: Secondary | ICD-10-CM | POA: Insufficient documentation

## 2021-12-12 DIAGNOSIS — J439 Emphysema, unspecified: Secondary | ICD-10-CM | POA: Insufficient documentation

## 2021-12-12 MED ORDER — GABAPENTIN 300 MG PO CAPS
300.0000 mg | ORAL_CAPSULE | Freq: Three times a day (TID) | ORAL | 3 refills | Status: DC
Start: 2021-12-12 — End: 2022-04-09
  Filled 2021-12-12: qty 90, 30d supply, fill #0
  Filled 2022-01-10: qty 90, 30d supply, fill #1
  Filled 2022-02-11: qty 90, 30d supply, fill #2
  Filled 2022-03-11: qty 90, 30d supply, fill #3

## 2021-12-12 MED ORDER — PANTOPRAZOLE SODIUM 40 MG PO TBEC
40.0000 mg | DELAYED_RELEASE_TABLET | Freq: Every day | ORAL | 1 refills | Status: DC
  Filled 2021-12-12: qty 90, 90d supply, fill #0
  Filled 2022-03-11 (×2): qty 90, 90d supply, fill #1

## 2021-12-12 MED ORDER — HYDROXYZINE PAMOATE 25 MG PO CAPS
25.0000 mg | ORAL_CAPSULE | Freq: Three times a day (TID) | ORAL | 2 refills | Status: DC | PRN
Start: 2021-12-12 — End: 2022-01-30
  Filled 2021-12-12: qty 60, 20d supply, fill #0

## 2021-12-12 MED ORDER — ALBUTEROL SULFATE HFA 108 (90 BASE) MCG/ACT IN AERS
2.0000 | INHALATION_SPRAY | Freq: Four times a day (QID) | RESPIRATORY_TRACT | 2 refills | Status: AC | PRN
  Filled 2021-12-12: qty 18, 25d supply, fill #0
  Filled 2022-03-11: qty 6.7, 28d supply, fill #1
  Filled 2022-12-04: qty 18, 25d supply, fill #1

## 2021-12-12 MED ORDER — BUDESONIDE-FORMOTEROL FUMARATE 80-4.5 MCG/ACT IN AERO
2.0000 | INHALATION_SPRAY | Freq: Two times a day (BID) | RESPIRATORY_TRACT | 3 refills | Status: AC
  Filled 2021-12-12: qty 10.2, 30d supply, fill #0
  Filled 2022-03-11 – 2022-12-04 (×3): qty 10.2, 30d supply, fill #1

## 2021-12-12 NOTE — Progress Notes (Signed)
Swollen on the lypm nodes and arms Back to hip pain 8 Refill med Talk about spots on the bdy

## 2021-12-12 NOTE — Progress Notes (Signed)
Assessment & Plan:  Lindsay Friedman was seen today for back pain.  Diagnoses and all orders for this visit:  Submandibular lymphadenopathy -     US Soft Tissue Head/Neck (NON-THYROID); Future -     CMP14+EGFR -     CBC with Differential  Anxiety -     hydrOXYzine (VISTARIL) 25 MG capsule; Take 1 capsule (25 mg total) by mouth every 8 (eight) hours as needed.  Pulmonary emphysema, unspecified emphysema type (HCC) -     albuterol (VENTOLIN HFA) 108 (90 Base) MCG/ACT inhaler; Inhale 2 puffs into the lungs every 6 (six) hours as needed for wheezing or shortness of breath. Needs pass -     budesonide-formoterol (SYMBICORT) 80-4.5 MCG/ACT inhaler; Inhale 2 puffs into the lungs 2 (two) times daily.  Colon cancer screening -     Fecal occult blood, imunochemical([facility]/Sun[facility])  Dyslipidemia, goal LDL below 100 -     Lipid panel  Gastritis and gastroduodenitis -     pantoprazole (PROTONIX) 40 MG tablet; Take 1 tablet (40 mg total) by mouth daily. -     CBC with Differential  Sciatica of left side -     gabapentin (NEURONTIN) 300 MG capsule; Take 1 capsule (300 mg total) by mouth 3 (three) times daily.    Patient has been counseled on age-appropriate routine health concerns for screening and prevention. These are reviewed and up-to-date. Referrals have been placed accordingly. Immunizations are up-to-date or declined.    Subjective:   Chief Complaint  Patient presents with   Back Pain    Swollen Lymph nodes   HPI Lindsay Friedman 51 y.o. female presents to office today for back pain and swollen right submandibular lymph node.  PMH: COPD, migraines, anxiety, PTX, kidney stones, superior mesenteric artery syndrome (no operative intervention per General Surgery), gastric ulcer (non bleeding), gastritis  Notes swelling of Right submandibular lymph node with no reduction in size over the past several weeks. Denies any viral symptoms or dental pain.   Endorses low back pain which "comes  and goes". Gabapentin seems to help relieve pain however she ran out of this medication some time ago. Unrelated to any injury or trauma.   Notes increased anxiety due to her daughter who was reportedly missing for some time but has now returned.        Review of Systems  Constitutional:  Negative for fever, malaise/fatigue and weight loss.  HENT:  Negative for nosebleeds.        SEE HPI  Eyes: Negative.  Negative for blurred vision, double vision and photophobia.  Respiratory: Negative.  Negative for cough and shortness of breath.   Cardiovascular: Negative.  Negative for chest pain, palpitations and leg swelling.  Gastrointestinal:  Positive for heartburn. Negative for abdominal pain, blood in stool, constipation, diarrhea, melena, nausea and vomiting.  Musculoskeletal:  Positive for back pain. Negative for myalgias.  Neurological: Negative.  Negative for dizziness, focal weakness, seizures and headaches.  Psychiatric/Behavioral:  Negative for suicidal ideas. The patient is nervous/anxious.     Past Medical History:  Diagnosis Date   COPD (chronic obstructive pulmonary disease) ([place])    Kidney stone    Migraines    Pneumonia    Pneumothorax     Past Surgical History:  Procedure Laterality Date   ABDOMINAL HYSTERECTOMY     BIOPSY  09/08/2021   Procedure: BIOPSY;  Surgeon: Thornton Park, MD;  Location: [place];  Service: Gastroenterology;;   ESOPHAGOGASTRODUODENOSCOPY (EGD) WITH PROPOFOL N/A 09/08/2021  Procedure: ESOPHAGOGASTRODUODENOSCOPY (EGD) WITH PROPOFOL;  Surgeon: Thornton Park, MD;  Location: [place];  Service: Gastroenterology;  Laterality: N/A;   LUNG SURGERY      History reviewed. No pertinent family history.  Social History Reviewed with no changes to be made today.   Outpatient Medications Prior to Visit  Medication Sig Dispense Refill   ondansetron (ZOFRAN-ODT) 4 MG disintegrating tablet Take 1 tablet (4 mg total) by mouth every 8 (eight)  hours as needed for nausea or vomiting. 42 tablet 1   pantoprazole (PROTONIX) 40 MG tablet Take 1 tablet (40 mg total) by mouth daily. 120 tablet 1   No facility-administered medications prior to visit.    Allergies  Allergen Reactions   Bee Venom Anaphylaxis       Objective:    BP 105/74 (BP Location: Right Arm, Patient Position: Sitting, Cuff Size: Small)   Pulse 97   Temp 98.2 F (36.8 C) (Oral)   Resp 16   Wt 110 lb 9.6 oz (50.2 kg)   SpO2 98%   BMI 20.23 kg/m  Wt Readings from Last 3 Encounters:  12/12/21 110 lb 9.6 oz (50.2 kg)  09/08/21 104 lb 11.5 oz (47.5 kg)  02/07/21 105 lb 13.1 oz (48 kg)    Physical Exam Vitals and nursing note reviewed.  Constitutional:      Appearance: She is well-developed.  HENT:     Head: Normocephalic and atraumatic.   Cardiovascular:     Rate and Rhythm: Normal rate and regular rhythm.     Heart sounds: Normal heart sounds. No murmur heard.    No friction rub. No gallop.  Pulmonary:     Effort: Pulmonary effort is normal. No tachypnea or respiratory distress.     Breath sounds: Normal breath sounds. No decreased breath sounds, wheezing, rhonchi or rales.  Chest:     Chest wall: No tenderness.  Abdominal:     General: Bowel sounds are normal.     Palpations: Abdomen is soft.  Musculoskeletal:        General: Normal range of motion.     Cervical back: Normal range of motion.  Lymphadenopathy:     Head:     Right side of head: Submandibular adenopathy present.  Skin:    General: Skin is warm and dry.  Neurological:     Mental Status: She is alert and oriented to person, place, and time.     Coordination: Coordination normal.  Psychiatric:        Behavior: Behavior normal. Behavior is cooperative.        Thought Content: Thought content normal.        Judgment: Judgment normal.          Patient has been counseled extensively about nutrition and exercise as well as the importance of adherence with medications and  regular follow-up. The patient was given clear instructions to go to ER or return to medical center if symptoms don't improve, worsen or new problems develop. The patient verbalized understanding.   Follow-up: Return if symptoms worsen or fail to improve.   Gildardo Pounds, FNP-BC [facility] [place], [place]   12/17/2021, 9:29 PM

## 2021-12-12 NOTE — Patient Instructions (Signed)
PLS CALL TO SCHEDULE YOUR MAMMOGRAM  [facility] [address] [place], [place] [address] Phone [phone] Fax [phone]

## 2021-12-13 ENCOUNTER — Other Ambulatory Visit: Payer: Self-pay

## 2021-12-13 LAB — CMP14+EGFR
ALT: 11 IU/L (ref 0–32)
AST: 24 IU/L (ref 0–40)
Albumin/Globulin Ratio: 2.3 — ABNORMAL HIGH (ref 1.2–2.2)
Albumin: 4.8 g/dL (ref 3.9–4.9)
Alkaline Phosphatase: 86 IU/L (ref 44–121)
BUN/Creatinine Ratio: 13 (ref 9–23)
BUN: 10 mg/dL (ref 6–24)
Bilirubin Total: <0.2 mg/dL (ref 0.0–1.2)
CO2: 24 mmol/L (ref 20–29)
Calcium: 9.5 mg/dL (ref 8.7–10.2)
Chloride: 102 mmol/L (ref 96–106)
Creatinine, Ser: 0.8 mg/dL (ref 0.57–1.00)
Globulin, Total: 2.1 g/dL (ref 1.5–4.5)
Glucose: 79 mg/dL (ref 70–99)
Potassium: 4.3 mmol/L (ref 3.5–5.2)
Sodium: 142 mmol/L (ref 134–144)
Total Protein: 6.9 g/dL (ref 6.0–8.5)
eGFR: 90 mL/min/{1.73_m2} (ref >59)

## 2021-12-13 LAB — LIPID PANEL
Chol/HDL Ratio: 3.6 ratio (ref 0.0–4.4)
Cholesterol, Total: 222 mg/dL — ABNORMAL HIGH (ref 100–199)
HDL: 62 mg/dL (ref >39)
LDL Chol Calc (NIH): 145 mg/dL — ABNORMAL HIGH (ref 0–99)
Triglycerides: 86 mg/dL (ref 0–149)
VLDL Cholesterol Cal: 15 mg/dL (ref 5–40)

## 2021-12-13 LAB — CBC WITH DIFFERENTIAL/PLATELET
Basophils Absolute: 0.1 10*3/uL (ref 0.0–0.2)
Basos: 1 %
EOS (ABSOLUTE): 0.1 10*3/uL (ref 0.0–0.4)
Eos: 1 %
Hematocrit: 36.2 % (ref 34.0–46.6)
Hemoglobin: 12.4 g/dL (ref 11.1–15.9)
Immature Grans (Abs): 0 10*3/uL (ref 0.0–0.1)
Immature Granulocytes: 0 %
Lymphocytes Absolute: 5.2 10*3/uL — ABNORMAL HIGH (ref 0.7–3.1)
Lymphs: 50 %
MCH: 33.4 pg — ABNORMAL HIGH (ref 26.6–33.0)
MCHC: 34.3 g/dL (ref 31.5–35.7)
MCV: 98 fL — ABNORMAL HIGH (ref 79–97)
Monocytes Absolute: 0.6 10*3/uL (ref 0.1–0.9)
Monocytes: 6 %
Neutrophils Absolute: 4.3 10*3/uL (ref 1.4–7.0)
Neutrophils: 42 %
Platelets: 244 10*3/uL (ref 150–450)
RBC: 3.71 x10E6/uL — ABNORMAL LOW (ref 3.77–5.28)
RDW: 12.7 % (ref 11.7–15.4)
WBC: 10.2 10*3/uL (ref 3.4–10.8)

## 2021-12-14 ENCOUNTER — Other Ambulatory Visit: Payer: Self-pay

## 2021-12-17 ENCOUNTER — Encounter: Payer: Self-pay | Admitting: Nurse Practitioner

## 2021-12-19 ENCOUNTER — Ambulatory Visit (HOSPITAL_COMMUNITY)
Admission: RE | Admit: 2021-12-19 | Discharge: 2021-12-19 | Disposition: A | Discharge status: Home / Self Care | Payer: Medicaid Other | Source: Ambulatory Visit | Attending: Nurse Practitioner | Admitting: Nurse Practitioner

## 2021-12-19 DIAGNOSIS — R590 Localized enlarged lymph nodes: Secondary | ICD-10-CM | POA: Insufficient documentation

## 2022-01-04 ENCOUNTER — Other Ambulatory Visit: Payer: Self-pay

## 2022-01-09 ENCOUNTER — Ambulatory Visit: Payer: Medicaid Other | Admitting: Dermatology

## 2022-01-10 ENCOUNTER — Other Ambulatory Visit: Payer: Self-pay

## 2022-01-30 ENCOUNTER — Ambulatory Visit: Payer: Medicaid Other | Admitting: Nurse Practitioner

## 2022-01-30 ENCOUNTER — Other Ambulatory Visit (HOSPITAL_COMMUNITY)
Admission: RE | Admit: 2022-01-30 | Discharge: 2022-01-30 | Disposition: A | Discharge status: Home / Self Care | Payer: Medicaid Other | Source: Ambulatory Visit | Attending: Nurse Practitioner | Admitting: Nurse Practitioner

## 2022-01-30 ENCOUNTER — Encounter: Payer: Self-pay | Admitting: Critical Care Medicine

## 2022-01-30 ENCOUNTER — Other Ambulatory Visit: Payer: Self-pay

## 2022-01-30 ENCOUNTER — Ambulatory Visit: Payer: Self-pay | Attending: Nurse Practitioner | Admitting: Critical Care Medicine

## 2022-01-30 VITALS — BP 103/69 | HR 80 | Temp 98.0°F | Ht 62.0 in | Wt 112.0 lb

## 2022-01-30 DIAGNOSIS — Z113 Encounter for screening for infections with a predominantly sexual mode of transmission: Secondary | ICD-10-CM | POA: Diagnosis present

## 2022-01-30 DIAGNOSIS — R21 Rash and other nonspecific skin eruption: Secondary | ICD-10-CM | POA: Insufficient documentation

## 2022-01-30 DIAGNOSIS — R300 Dysuria: Secondary | ICD-10-CM | POA: Insufficient documentation

## 2022-01-30 DIAGNOSIS — M5442 Lumbago with sciatica, left side: Secondary | ICD-10-CM

## 2022-01-30 DIAGNOSIS — N3000 Acute cystitis without hematuria: Secondary | ICD-10-CM

## 2022-01-30 LAB — POCT URINALYSIS DIP (CLINITEK)
Bilirubin, UA: NEGATIVE
Blood, UA: NEGATIVE
Glucose, UA: NEGATIVE mg/dL
Ketones, POC UA: NEGATIVE mg/dL
Leukocytes, UA: NEGATIVE
Nitrite, UA: NEGATIVE
POC PROTEIN,UA: NEGATIVE
Spec Grav, UA: <=1.005 — AB (ref 1.010–1.025)
Urobilinogen, UA: 0.2 E.U./dL
pH, UA: 6 (ref 5.0–8.0)

## 2022-01-30 MED ORDER — CLOBETASOL PROPIONATE 0.05 % EX CREA
1.0000 | TOPICAL_CREAM | Freq: Two times a day (BID) | CUTANEOUS | 0 refills | Status: AC
  Filled 2022-01-30: qty 30, 15d supply, fill #0

## 2022-01-30 NOTE — Assessment & Plan Note (Signed)
Symptoms consistent with acute cystitis point-of-care testing negative we will send for full urinalysis and urine culture based upon this further treatment will be recommended

## 2022-01-30 NOTE — Assessment & Plan Note (Signed)
Acute onset lower back pain with left lower sciatica plan for this patient is to observe for now and see what the work-up of the urinalysis shows  She does not have any insurance asked her to pick up financial assistance application and she may need physical therapy

## 2022-01-30 NOTE — Assessment & Plan Note (Signed)
Complete STD screening will be achieved including cervical vaginal RPR HIV hep B hep C

## 2022-01-30 NOTE — Patient Instructions (Signed)
Pick up application for discount financial services in order so we can send you to physical therapy for your back  I will check with Ms. Meredeth Ide regarding the status of whether you need continued Pap smears whether or not you have a cervix after your total hysterectomy  Urinalysis urine culture and STD screening was obtained also cervical vaginal swab was obtained we will call you results  Based on results of above studies antibiotics may be ordered we will call you with those results  Do not place any more IcyHot's on your back I gave you a steroid cream for your back to apply that was sent to our pharmacy downstairs  Keep your established follow-up appointments with your primary care doctor  No other medication changes

## 2022-01-30 NOTE — Progress Notes (Signed)
Acute Office Visit  Subjective:     Patient ID: Lindsay Friedman, female    DOB: 1970-11-05, 51 y.o.   MRN: [id number]  Chief Complaint  Patient presents with   Acute Visit    This a 51 year old female seen today for an acute work in visit.  She has 4-day history of low back pain and dysuria she had a partner for 8 years who just left her and she would like to have STD screening.  She has had a TAH/BSO but does not know if the cervix was removed.  The surgery occurred in early 2000's we have no records.  She denies any vaginal discharge.  She does note increased frequency of urination.  On arrival point-of-care urinalysis is negative.  She also been placing IcyHot on her lower back however has a rash from this.    Review of Systems  Constitutional:  Negative for chills, diaphoresis, fever, malaise/fatigue and weight loss.  HENT:  Negative for congestion, hearing loss, nosebleeds, sore throat and tinnitus.   Eyes:  Negative for blurred vision, photophobia and redness.  Respiratory:  Positive for shortness of breath. Negative for cough, hemoptysis, sputum production, wheezing and stridor.   Cardiovascular:  Negative for chest pain, palpitations, orthopnea, claudication, leg swelling and PND.  Gastrointestinal:  Negative for abdominal pain, blood in stool, constipation, diarrhea, heartburn, nausea and vomiting.  Genitourinary:  Positive for dysuria, flank pain and urgency. Negative for frequency and hematuria.  Musculoskeletal:  Positive for back pain. Negative for falls, joint pain, myalgias and neck pain.  Skin:  Negative for itching and rash.  Neurological:  Negative for dizziness, tingling, tremors, sensory change, speech change, focal weakness, seizures, loss of consciousness, weakness and headaches.  Endo/Heme/Allergies:  Negative for environmental allergies and polydipsia. Does not bruise/bleed easily.  Psychiatric/Behavioral:  Negative for depression, memory loss, substance abuse  and suicidal ideas. The patient is not nervous/anxious and does not have insomnia.         Objective:    BP 103/69   Pulse 80   Temp 98 F (36.7 C) (Oral)   Ht 5\' 2"  (1.575 m)   Wt 112 lb (50.8 kg)   SpO2 98%   BMI 20.49 kg/m    Physical Exam Vitals reviewed.  Constitutional:      Appearance: Normal appearance. She is well-developed. She is not diaphoretic.  HENT:     Head: Normocephalic and atraumatic.     Nose: No nasal deformity, septal deviation, mucosal edema or rhinorrhea.     Right Sinus: No maxillary sinus tenderness or frontal sinus tenderness.     Left Sinus: No maxillary sinus tenderness or frontal sinus tenderness.     Mouth/Throat:     Pharynx: No oropharyngeal exudate.  Eyes:     General: No scleral icterus.    Conjunctiva/sclera: Conjunctivae normal.     Pupils: Pupils are equal, round, and reactive to light.  Neck:     Thyroid: No thyromegaly.     Vascular: No carotid bruit or JVD.     Trachea: Trachea normal. No tracheal tenderness or tracheal deviation.  Cardiovascular:     Rate and Rhythm: Normal rate and regular rhythm.     Chest Wall: PMI is not displaced.     Pulses: Normal pulses. No decreased pulses.     Heart sounds: Normal heart sounds, S1 normal and S2 normal. Heart sounds not distant. No murmur heard.    No systolic murmur is present.     No  diastolic murmur is present.     No friction rub. No gallop. No S3 or S4 sounds.  Pulmonary:     Effort: Pulmonary effort is normal. No tachypnea, accessory muscle usage or respiratory distress.     Breath sounds: No stridor. No decreased breath sounds, wheezing, rhonchi or rales.     Comments: Distant breath sounds Chest:     Chest wall: No tenderness.  Abdominal:     General: Bowel sounds are normal. There is no distension.     Palpations: Abdomen is soft. Abdomen is not rigid.     Tenderness: There is abdominal tenderness. There is no right CVA tenderness, left CVA tenderness, guarding or  rebound.     Comments: Suprapubic tenderness  Musculoskeletal:        General: Tenderness present. Normal range of motion.     Cervical back: Normal range of motion and neck supple. No edema, erythema or rigidity. No muscular tenderness. Normal range of motion.     Comments: Midline tenderness lower back  Lymphadenopathy:     Head:     Right side of head: No submental or submandibular adenopathy.     Left side of head: No submental or submandibular adenopathy.     Cervical: No cervical adenopathy.  Skin:    General: Skin is warm and dry.     Coloration: Skin is not pale.     Findings: Rash present.     Nails: There is no clubbing.     Comments: Rash over the site of where the IcyHot patches of been applied  Neurological:     Mental Status: She is alert and oriented to person, place, and time.     Sensory: No sensory deficit.  Psychiatric:        Speech: Speech normal.        Behavior: Behavior normal.     Results for orders placed or performed in visit on 01/30/22  POCT URINALYSIS DIP (CLINITEK)  Result Value Ref Range   Color, UA yellow yellow   Clarity, UA clear clear   Glucose, UA negative negative mg/dL   Bilirubin, UA negative negative   Ketones, POC UA negative negative mg/dL   Spec Grav, UA <=7.782 (A) 1.010 - 1.025   Blood, UA negative negative   pH, UA 6.0 5.0 - 8.0   POC PROTEIN,UA negative negative, trace   Urobilinogen, UA 0.2 0.2 or 1.0 E.U./dL   Nitrite, UA Negative Negative   Leukocytes, UA Negative Negative        Assessment & Plan:   Problem List Items Addressed This Visit       Nervous and Auditory   Acute bilateral low back pain with left-sided sciatica    Acute onset lower back pain with left lower sciatica plan for this patient is to observe for now and see what the work-up of the urinalysis shows  She does not have any insurance asked her to pick up financial assistance application and she may need physical therapy        Genitourinary    Acute cystitis without hematuria - Primary    Symptoms consistent with acute cystitis point-of-care testing negative we will send for full urinalysis and urine culture based upon this further treatment will be recommended      Relevant Orders   POCT URINALYSIS DIP (CLINITEK) (Completed)   Urine Culture   Urinalysis     Other   Screen for STD (sexually transmitted disease)    Complete STD screening  will be achieved including cervical vaginal RPR HIV hep B hep C      Relevant Orders   Cervicovaginal ancillary only   RPR w/reflex to TrepSure   HIV antibody (with reflex)   HCV Ab w Reflex to Quant PCR   Hep B Core Ab W/Reflex    Meds ordered this encounter  Medications   clobetasol cream (TEMOVATE) 0.05 %    Sig: Apply 1 Application topically 2 (two) times daily.    Dispense:  30 g    Refill:  0    No follow-ups on file.  Shan Levans, MD

## 2022-01-31 LAB — CERVICOVAGINAL ANCILLARY ONLY
Bacterial Vaginitis (gardnerella): POSITIVE — AB
Candida Glabrata: NEGATIVE
Candida Vaginitis: NEGATIVE
Chlamydia: NEGATIVE
Comment: NEGATIVE
Comment: NEGATIVE
Comment: NEGATIVE
Comment: NEGATIVE
Comment: NEGATIVE
Comment: NORMAL
Neisseria Gonorrhea: NEGATIVE
Trichomonas: NEGATIVE

## 2022-01-31 NOTE — Progress Notes (Signed)
Lindsay Friedman Syphilis test shows no active disease only past exposure., all other STDs are negative, Hep C /B and HIV neg  Let the patient know her cerv vag swab is pos for bacteria,  urine culture is pending, an antibiotic needed for vaginal infection but waiting on urine culture which will be out tomorrow

## 2022-02-01 LAB — URINE CULTURE

## 2022-02-02 LAB — HBCIGM: Hep B C IgM: NEGATIVE

## 2022-02-02 LAB — URINALYSIS
Bilirubin, UA: NEGATIVE
Glucose, UA: NEGATIVE
Ketones, UA: NEGATIVE
Leukocytes,UA: NEGATIVE
Nitrite, UA: NEGATIVE
Protein,UA: NEGATIVE
RBC, UA: NEGATIVE
Specific Gravity, UA: 1.006 (ref 1.005–1.030)
Urobilinogen, Ur: 0.2 mg/dL (ref 0.2–1.0)
pH, UA: 7 (ref 5.0–7.5)

## 2022-02-02 LAB — HCV INTERPRETATION

## 2022-02-02 LAB — HIV ANTIBODY (ROUTINE TESTING W REFLEX): HIV Screen 4th Generation wRfx: NONREACTIVE

## 2022-02-02 LAB — RPR, QUANT: RPR, Quant: 1:1 {titer} — ABNORMAL HIGH

## 2022-02-02 LAB — HEPATITIS B CORE AB W/REFLEX: Hep B Core Total Ab: POSITIVE — AB

## 2022-02-02 LAB — RPR W/REFLEX TO TREPSURE: RPR: REACTIVE — AB

## 2022-02-02 LAB — HCV AB W REFLEX TO QUANT PCR: HCV Ab: NONREACTIVE

## 2022-02-03 NOTE — Progress Notes (Signed)
Patient aware. Metronidazole orderd for BV

## 2022-02-04 ENCOUNTER — Other Ambulatory Visit: Payer: Self-pay

## 2022-02-11 ENCOUNTER — Other Ambulatory Visit: Payer: Self-pay

## 2022-02-14 ENCOUNTER — Telehealth: Payer: Medicaid Other | Admitting: Family Medicine

## 2022-02-14 ENCOUNTER — Ambulatory Visit: Payer: Self-pay | Admitting: *Deleted

## 2022-02-14 DIAGNOSIS — F419 Anxiety disorder, unspecified: Secondary | ICD-10-CM

## 2022-02-14 NOTE — Telephone Encounter (Signed)
Chief Complaint: mouth sores/film after antibiotic therapy- patient is scheduled for virtual UC appointment for that- but then states she had anxiety- with that triage- advised ED/behavioral health UC Symptoms: anxiety, not sleeping, chest pain, feels like she is choking- daughter has had multiple drug related overdoses and intubations Frequency: getting worse-1 year Pertinent Negatives: Patient denies no suicidal thoughts/plan Disposition: [x] ED /[] Urgent Care (no appt availability in office) / [] Appointment(In office/virtual)/ []  [facility]/ [] Home Care/ [] Refused Recommended Disposition /[] [place] [facility]/ []  Follow-up with PCP Additional Notes: Patient needs to be scheduled for follow up- she states she will go to ED today   Reason for Disposition  4 or more ulcers  [1] SEVERE anxiety (e.g., extremely anxious with intense emotional symptoms such as feeling of unreality, urge to flee, unable to calm down; unable to cope or function) AND [2] not better after 10 minutes of reassurance and Care Advice  Answer Assessment - Initial Assessment Questions 1. LOCATION: "Where is the ulcer located?"      tongue 2. NUMBER: "How many ulcers are there?"      2 3. SIZE: "How large is the ulcer?"      small 4. SEVERITY: "Are they painful?" If Yes, ask: "How bad is it?"  (Scale 1-10; or mild, moderate, severe)  - MILD - eating  and drinking normally   - MODERATE - decreased liquid intake   - SEVERE - drinking very little      moderate 5. ONSET: "When did you first notice the ulcer?"      2 days 6. RECURRENT SYMPTOM: "Have you had a mouth ulcer before?" If Yes, ask: "When was the last time?" and "What happened that time?"      Yes- with antibiotic therapy 7. CAUSE: "What do you think is causing the mouth ulcer?"     Yeast possible 8. OTHER SYMPTOMS: "Do you have any other symptoms?" (e.g., fever)     no 9. PREGNANCY: "Is there any chance you are pregnant?" "When was your last  menstrual period?"  Answer Assessment - Initial Assessment Questions 1. CONCERN: "Did anything happen that prompted you to call today?"      Concern for daughter- frequent overdose 2. ANXIETY SYMPTOMS: "Can you describe how you (your loved one; patient) have been feeling?" (e.g., tense, restless, panicky, anxious, keyed up, overwhelmed, sense of impending doom).      Wakes at night, heart racing, nausea, chest feels heavy 3. ONSET: "How long have you been feeling this way?" (e.g., hours, days, weeks)     6 month- 1 year 4. SEVERITY: "How would you rate the level of anxiety?" (e.g., 0 - 10; or mild, moderate, severe).     severe 5. FUNCTIONAL IMPAIRMENT: "How have these feelings affected your ability to do daily activities?" "Have you had more difficulty than usual doing your normal daily activities?" (e.g., getting better, same, worse; self-care, school, work, interactions)     Yes- starting to affect things 6. HISTORY: "Have you felt this way before?" "Have you ever been diagnosed with an anxiety problem in the past?" (e.g., generalized anxiety disorder, panic attacks, PTSD). If Yes, ask: "How was this problem treated?" (e.g., medicines, counseling, etc.)     Yes- hx- but this is another level 7. RISK OF HARM - SUICIDAL IDEATION: "Do you ever have thoughts of hurting or killing yourself?" If Yes, ask:  "Do you have these feelings now?" "Do you have a plan on how you would do this?"  No- no plans 8. TREATMENT:  "What has been done so far to treat this anxiety?" (e.g., medicines, relaxation strategies). "What has helped?"     Tried different medications- Lexapro, hydrozine 9. TREATMENT - THERAPIST: "Do you have a counselor or therapist? Name?"     no 10. POTENTIAL TRIGGERS: "Do you drink caffeinated beverages (e.g., coffee, colas, teas), and how much daily?" "Do you drink alcohol or use any drugs?" "Have you started any new medicines recently?"       no 11. PATIENT SUPPORT: "Who is with you  now?" "Who do you live with?" "Do you have family or friends who you can talk to?"        Lives with father 70. OTHER SYMPTOMS: "Do you have any other symptoms?" (e.g., feeling depressed, trouble concentrating, trouble sleeping, trouble breathing, palpitations or fast heartbeat, chest pain, sweating, nausea, or diarrhea)       Nausea, palpitation 13. PREGNANCY: "Is there any chance you are pregnant?" "When was your last menstrual period?"  Protocols used: Mouth Ulcers-A-AH, Anxiety and Panic Attack-A-AH

## 2022-02-14 NOTE — Progress Notes (Signed)
[facility]   Triaged for ED- spoke with triage RN as well, I agree, needs with severe anxiety   Patient acknowledged agreement and understanding of the plan.

## 2022-03-11 ENCOUNTER — Other Ambulatory Visit: Payer: Self-pay

## 2022-03-18 ENCOUNTER — Other Ambulatory Visit: Payer: Self-pay

## 2022-04-09 ENCOUNTER — Other Ambulatory Visit: Payer: Self-pay | Admitting: Nurse Practitioner

## 2022-04-09 ENCOUNTER — Other Ambulatory Visit: Payer: Self-pay

## 2022-04-09 DIAGNOSIS — M5432 Sciatica, left side: Secondary | ICD-10-CM

## 2022-04-09 DIAGNOSIS — K2970 Gastritis, unspecified, without bleeding: Secondary | ICD-10-CM

## 2022-04-09 MED ORDER — PANTOPRAZOLE SODIUM 40 MG PO TBEC
40.0000 mg | DELAYED_RELEASE_TABLET | Freq: Every day | ORAL | 0 refills | Status: DC
  Filled 2022-04-09 – 2022-06-06 (×2): qty 90, 90d supply, fill #0

## 2022-04-09 MED ORDER — GABAPENTIN 300 MG PO CAPS
300.0000 mg | ORAL_CAPSULE | Freq: Three times a day (TID) | ORAL | 3 refills | Status: DC
  Filled 2022-04-09: qty 90, 30d supply, fill #0
  Filled 2022-05-07: qty 90, 30d supply, fill #1
  Filled 2022-06-06: qty 90, 30d supply, fill #2
  Filled 2022-07-03: qty 90, 30d supply, fill #3

## 2022-04-10 ENCOUNTER — Other Ambulatory Visit: Payer: Self-pay

## 2022-04-12 ENCOUNTER — Other Ambulatory Visit: Payer: Self-pay

## 2022-05-07 ENCOUNTER — Other Ambulatory Visit: Payer: Self-pay

## 2022-06-06 ENCOUNTER — Other Ambulatory Visit: Payer: Self-pay

## 2022-06-07 ENCOUNTER — Other Ambulatory Visit: Payer: Self-pay

## 2022-07-03 ENCOUNTER — Other Ambulatory Visit: Payer: Self-pay

## 2022-09-27 ENCOUNTER — Other Ambulatory Visit: Payer: Self-pay | Admitting: Nurse Practitioner

## 2022-09-27 ENCOUNTER — Other Ambulatory Visit: Payer: Self-pay

## 2022-09-27 ENCOUNTER — Other Ambulatory Visit: Payer: Self-pay | Admitting: Family Medicine

## 2022-09-27 DIAGNOSIS — M5432 Sciatica, left side: Secondary | ICD-10-CM

## 2022-09-27 DIAGNOSIS — K2970 Gastritis, unspecified, without bleeding: Secondary | ICD-10-CM

## 2022-09-27 MED ORDER — GABAPENTIN 300 MG PO CAPS
300.0000 mg | ORAL_CAPSULE | Freq: Three times a day (TID) | ORAL | 3 refills | Status: AC
Start: 2022-09-27 — End: ?
  Filled 2022-09-27: qty 90, 30d supply, fill #0
  Filled 2022-12-04 (×2): qty 90, 30d supply, fill #1
  Filled 2022-12-25 – 2023-01-03 (×3): qty 90, 30d supply, fill #2
  Filled 2023-03-16: qty 90, 30d supply, fill #3

## 2022-09-28 ENCOUNTER — Other Ambulatory Visit: Payer: Self-pay | Admitting: Family Medicine

## 2022-09-28 DIAGNOSIS — K2970 Gastritis, unspecified, without bleeding: Secondary | ICD-10-CM

## 2022-09-30 ENCOUNTER — Other Ambulatory Visit: Payer: Self-pay

## 2022-09-30 MED ORDER — PANTOPRAZOLE SODIUM 40 MG PO TBEC
40.0000 mg | DELAYED_RELEASE_TABLET | Freq: Every day | ORAL | 0 refills | Status: DC
Start: 2022-09-30 — End: 2022-12-04
  Filled 2022-09-30: qty 90, 90d supply, fill #0

## 2022-09-30 NOTE — Telephone Encounter (Signed)
Called pt unable to leave message mailbox is full

## 2022-09-30 NOTE — Telephone Encounter (Signed)
Requested Prescriptions  Pending Prescriptions Disp Refills   pantoprazole (PROTONIX) 40 MG tablet 90 tablet 0    Sig: Take 1 tablet (40 mg total) by mouth daily.     Gastroenterology: Proton Pump Inhibitors Passed - 09/28/2022 12:55 PM      Passed - Valid encounter within last 12 months    Recent Outpatient Visits           8 months ago Acute cystitis without hematuria   [facility] Storm Frisk, MD   9 months ago Submandibular lymphadenopathy   [facility] Claiborne Rigg, NP   1 year ago Hospital discharge follow-up   [facility] Claiborne Rigg, NP   1 year ago Lump in lower inner quadrant of left breast   [facility], Gildardo Pounds, NP   1 year ago Sciatica of left side   [facility] Marcine Matar, MD

## 2022-11-05 IMAGING — CT CT ABD-PELV W/ CM
2 of 5 series · 16 of 46 positions shown, 18 images · IV contrast (Omni 300)
Comparison: 04/19/2020

CLINICAL DATA: 50-year-old female left lower quadrant abdominal
pain.

EXAM:
CT ABDOMEN AND PELVIS WITH CONTRAST
TECHNIQUE: Multidetector CT imaging of the abdomen and pelvis was performed
using the standard protocol following bolus administration of
intravenous contrast.

[Series 3: a/p w/ 5mm · axial · 0.83mm/px · z∈[+681,+1041]mm · 13 of 82 slices shown, 15 images]
[im 5/82  soft-tissue]
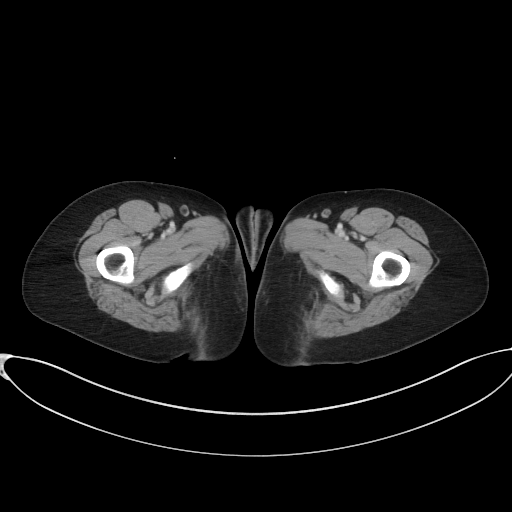
[im 5/82  bone]
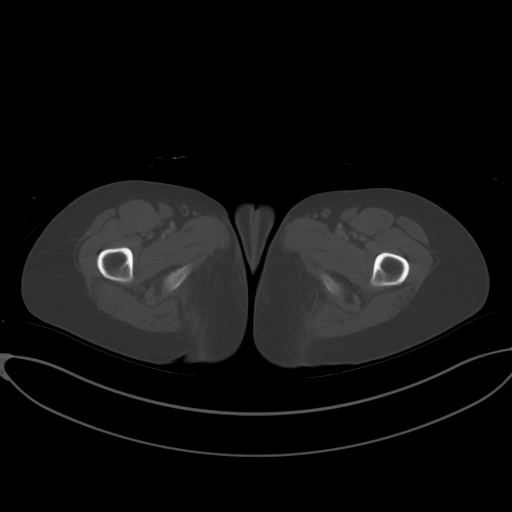
[im 13/82  soft-tissue]
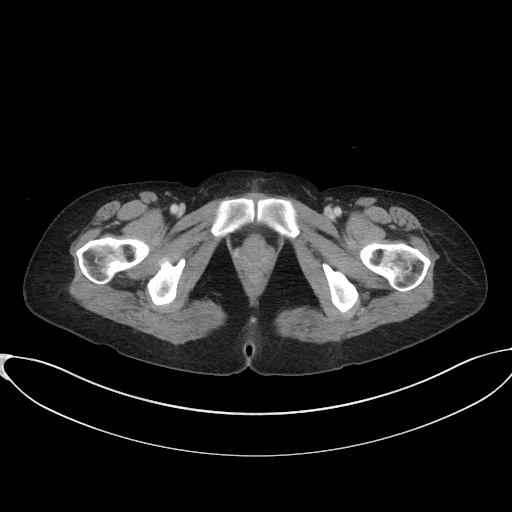
[im 18/82  soft-tissue]
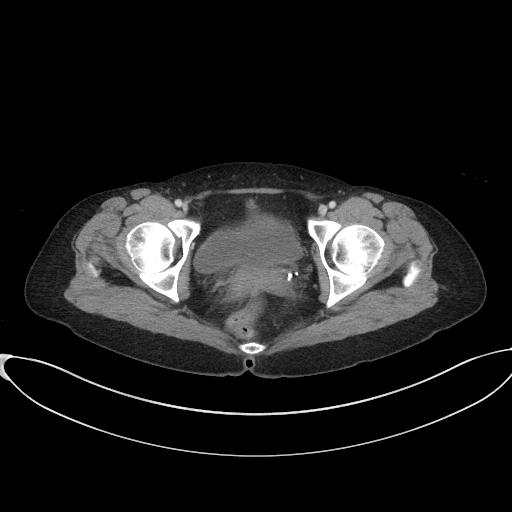
[im 22/82  soft-tissue]
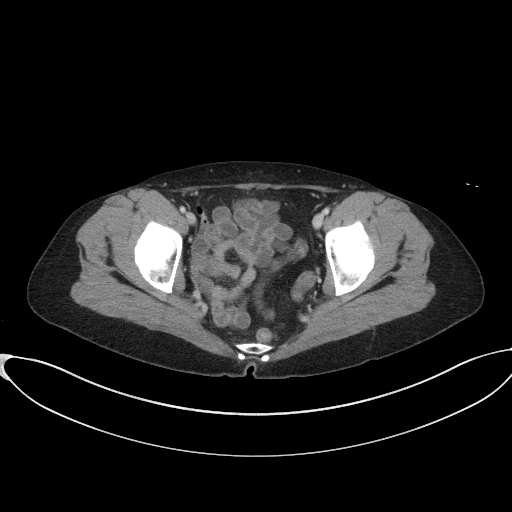
[im 30/82  soft-tissue]
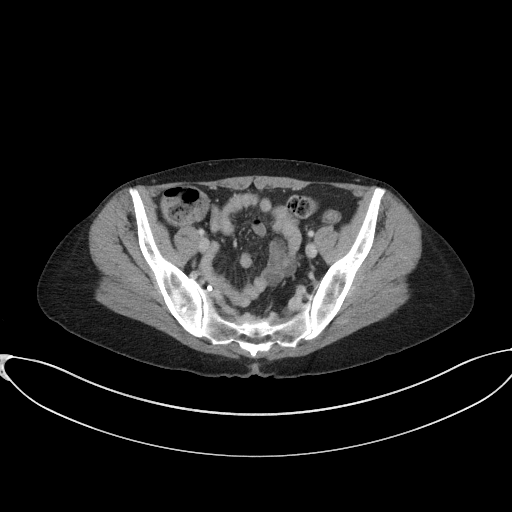
[im 35/82  soft-tissue]
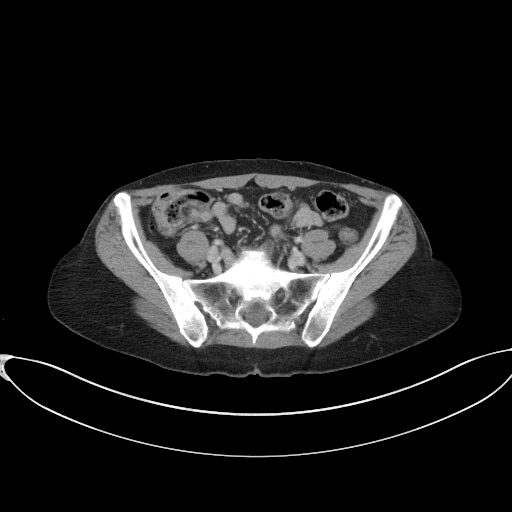
[im 43/82  soft-tissue]
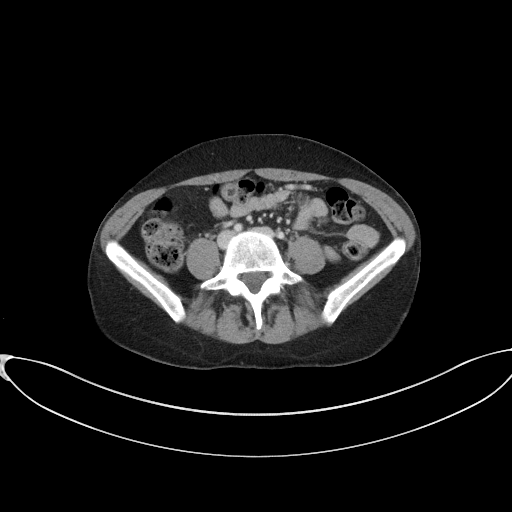
[im 47/82  soft-tissue]
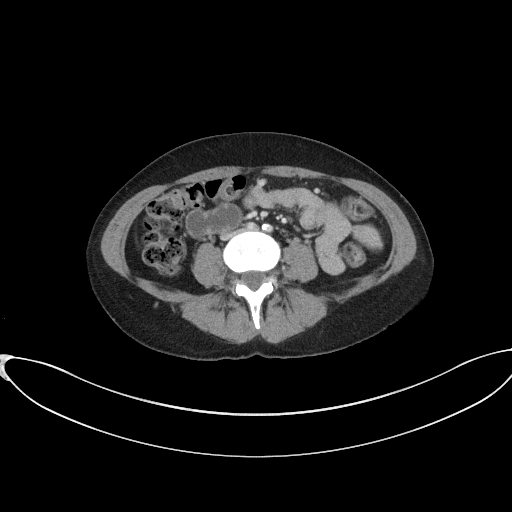
[im 52/82  soft-tissue]
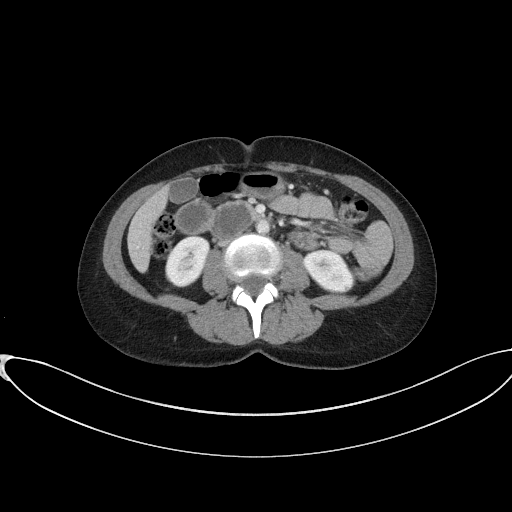
[im 52/82  bone]
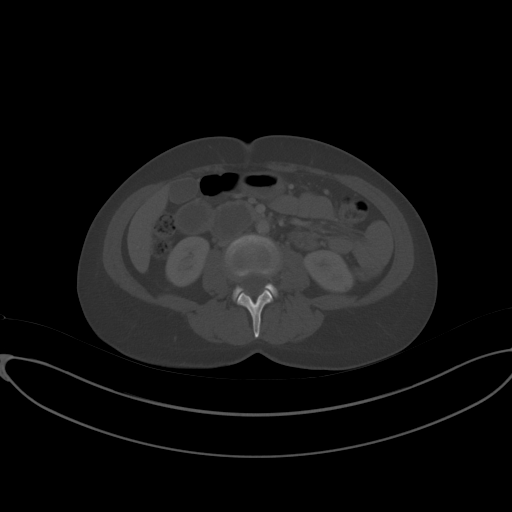
[im 60/82  soft-tissue]
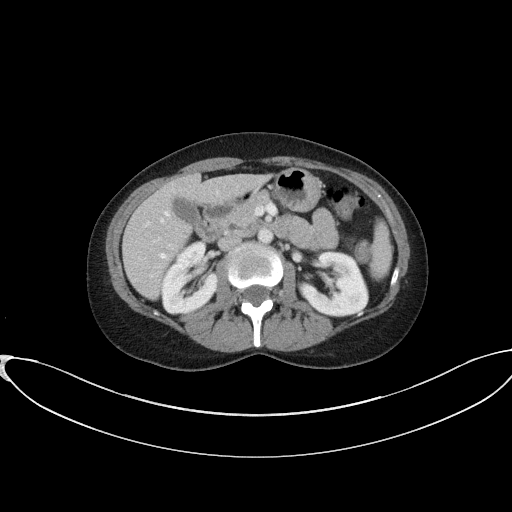
[im 64/82  soft-tissue]
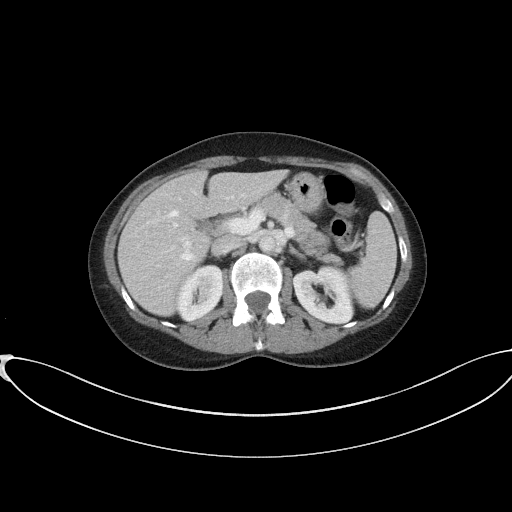
[im 69/82  soft-tissue]
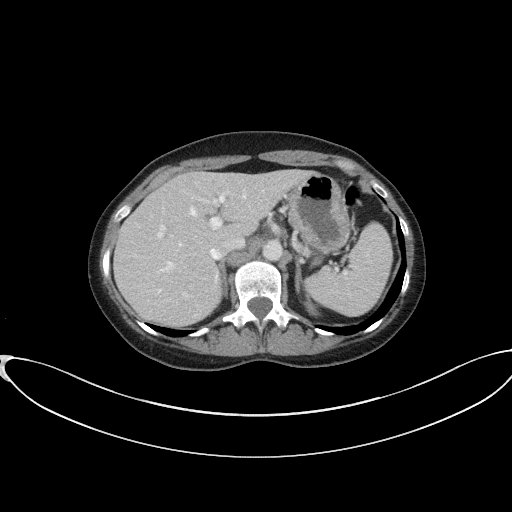
[im 77/82  soft-tissue]
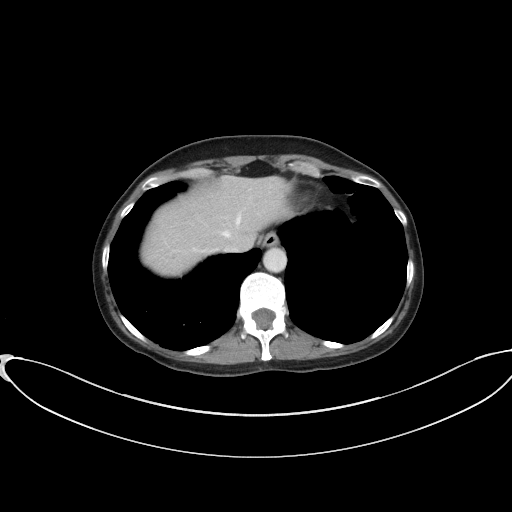

[Series 6: a/p w/ cor · coronal · 0.77mm/px · 3 of 122 slices shown]
[im 41/122  soft-tissue]
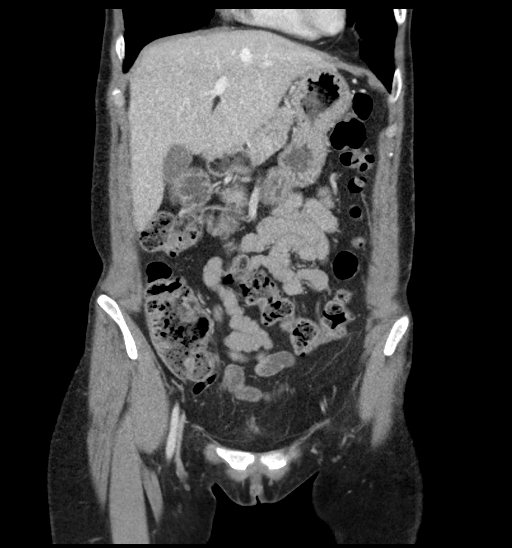
[im 54/122  soft-tissue]
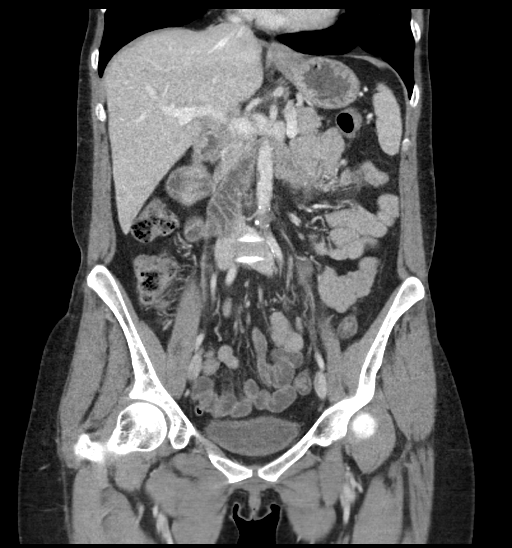
[im 68/122  soft-tissue]
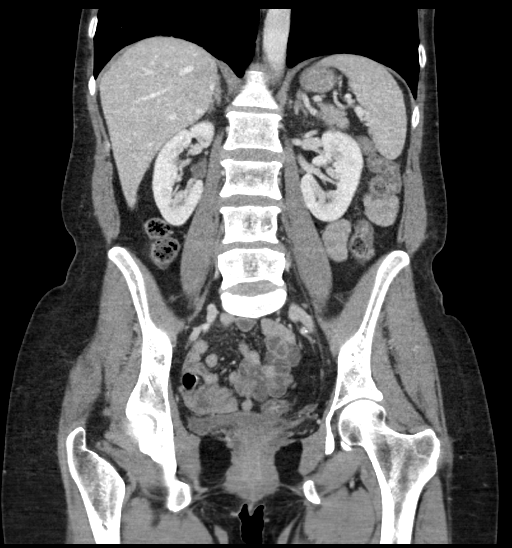

[16 of 46 positions shown; findings below may reference images not displayed]

RADIATION DOSE REDUCTION: This exam was performed according to the
departmental dose-optimization program which includes automated
exposure control, adjustment of the mA and/or kV according to
patient size and/or use of iterative reconstruction technique.

CONTRAST:  60mL OMNIPAQUE IOHEXOL 350 MG/ML SOLN
FINDINGS: Lower chest: Severe centrilobular emphysema. Heart is normal in
size.

Hepatobiliary: There are few scattered subcentimeter hypodensities
in the left lobe liver, too small to characterize by CT, unchanged
from comparison, and favored represent simple hepatic cysts. No
gallstones, gallbladder wall thickening, or biliary dilatation.

Pancreas: Unremarkable. No pancreatic ductal dilatation or
surrounding inflammatory changes.

Spleen: Normal in size without focal abnormality.

Adrenals/Urinary Tract: Adrenal glands are unremarkable. Kidneys are
normal, without renal calculi, focal lesion, or hydronephrosis.
Bladder is unremarkable.

Stomach/Bowel: Stomach is within normal limits. Moderate dilation of
the first second and proximal third portion of the duodenum which
tapers abruptly at the level of the superior mesenteric artery.
Appendix appears normal. No evidence of bowel wall thickening,
distention, or inflammatory changes.

Vascular/Lymphatic: Aortic atherosclerosis. No enlarged abdominal or
pelvic lymph nodes.

Reproductive: Status post hysterectomy. No adnexal masses.

Other: No abdominal wall hernia or abnormality. No abdominopelvic
ascites.

Musculoskeletal: No acute or significant osseous findings.
IMPRESSION: 1. Moderate dilation of the proximal duodenum, tapering
significantly in the mid third portion at the level of the overlying
superior mesenteric artery. Anatomic configuration and clinical
presentation of vomiting raises suspicion for superior mesenteric
artery syndrome.
2. No additional acute or significant abdominopelvic abnormality.
3. Emphysema (EAYBB-0SU.Y).
4. Aortic Atherosclerosis (EAYBB-JW6.6).

## 2022-12-04 ENCOUNTER — Other Ambulatory Visit: Payer: Self-pay | Admitting: Nurse Practitioner

## 2022-12-04 ENCOUNTER — Other Ambulatory Visit: Payer: Self-pay

## 2022-12-04 DIAGNOSIS — K2970 Gastritis, unspecified, without bleeding: Secondary | ICD-10-CM

## 2022-12-04 MED ORDER — PANTOPRAZOLE SODIUM 40 MG PO TBEC
40.0000 mg | DELAYED_RELEASE_TABLET | Freq: Every day | ORAL | 0 refills | Status: AC
Start: 2022-12-04 — End: ?
  Filled 2022-12-04 – 2023-03-17 (×5): qty 30, 30d supply, fill #0

## 2022-12-05 ENCOUNTER — Other Ambulatory Visit: Payer: Self-pay

## 2022-12-10 ENCOUNTER — Other Ambulatory Visit: Payer: Self-pay

## 2022-12-25 ENCOUNTER — Other Ambulatory Visit: Payer: Self-pay

## 2022-12-31 ENCOUNTER — Other Ambulatory Visit: Payer: Self-pay

## 2023-01-01 ENCOUNTER — Emergency Department
Admission: EM | Admit: 2023-01-01 | Discharge: 2023-01-01 | Disposition: A | Discharge status: Home / Self Care | Payer: Medicaid Other | Attending: Emergency Medicine | Admitting: Emergency Medicine

## 2023-01-01 ENCOUNTER — Emergency Department: Payer: Medicaid Other

## 2023-01-01 ENCOUNTER — Encounter: Payer: Self-pay | Admitting: Medical Oncology

## 2023-01-01 DIAGNOSIS — J441 Chronic obstructive pulmonary disease with (acute) exacerbation: Secondary | ICD-10-CM | POA: Diagnosis not present

## 2023-01-01 DIAGNOSIS — R059 Cough, unspecified: Secondary | ICD-10-CM | POA: Diagnosis present

## 2023-01-01 LAB — BASIC METABOLIC PANEL
Anion gap: 10 (ref 5–15)
BUN: 9 mg/dL (ref 6–20)
CO2: 27 mmol/L (ref 22–32)
Calcium: 10.2 mg/dL (ref 8.9–10.3)
Chloride: 106 mmol/L (ref 98–111)
Creatinine, Ser: 0.82 mg/dL (ref 0.44–1.00)
GFR, Estimated: >60 mL/min (ref >60)
Glucose, Bld: 108 mg/dL — ABNORMAL HIGH (ref 70–99)
Potassium: 4 mmol/L (ref 3.5–5.1)
Sodium: 143 mmol/L (ref 135–145)

## 2023-01-01 LAB — CBC
HCT: 39.4 % (ref 36.0–46.0)
Hemoglobin: 13 g/dL (ref 12.0–15.0)
MCH: 33.9 pg (ref 26.0–34.0)
MCHC: 33 g/dL (ref 30.0–36.0)
MCV: 102.6 fL — ABNORMAL HIGH (ref 80.0–100.0)
Platelets: 240 10*3/uL (ref 150–400)
RBC: 3.84 MIL/uL — ABNORMAL LOW (ref 3.87–5.11)
RDW: 12.9 % (ref 11.5–15.5)
WBC: 8.5 10*3/uL (ref 4.0–10.5)
nRBC: 0 % (ref 0.0–0.2)

## 2023-01-01 LAB — TROPONIN I (HIGH SENSITIVITY): Troponin I (High Sensitivity): <2 ng/L (ref <18)

## 2023-01-01 MED ORDER — AZITHROMYCIN 500 MG PO TABS: 500.0000 mg | ORAL_TABLET | Freq: Every day | ORAL | 0 refills | Status: AC

## 2023-01-01 MED ORDER — PREDNISONE 20 MG PO TABS
60.0000 mg | ORAL_TABLET | Freq: Once | ORAL | Status: AC
  Administered 2023-01-01: 60 mg via ORAL
  Filled 2023-01-01: qty 3

## 2023-01-01 MED ORDER — PREDNISONE 20 MG PO TABS: 40.0000 mg | ORAL_TABLET | Freq: Every day | ORAL | 0 refills | Status: AC

## 2023-01-01 NOTE — Discharge Instructions (Signed)
Your blood work, chest x-ray and EKG was normal today.  I believe you are having a COPD exacerbation.  This is treated with 5 days of steroids and 5 days of antibiotics.  Please return to the ED if you develop any increased shortness of breath or chest pain.

## 2023-01-01 NOTE — ED Provider Notes (Signed)
[facility] Provider Note    Event Date/Time   First MD Initiated Contact with Patient 01/01/23 1407     (approximate)   History   Cough and Back Pain   HPI  Lindsay Friedman is a 52 y.o. female COPD, pneumonia and multiple pneumothorax presents for evaluation of cough and chest pain x 4 days.  Patient states she has had increased mucus production when she is coughing and describes it as clear.  Denies fevers.  She says her pain is worse with deep breathing.  She does endorse doing lots of heavy lifting in the past few days.      Physical Exam   Triage Vital Signs: ED Triage Vitals  Encounter Vitals Group     BP 01/01/23 1303 111/77     Systolic BP Percentile --      Diastolic BP Percentile --      Pulse Rate 01/01/23 1303 86     Resp 01/01/23 1303 15     Temp 01/01/23 1303 97.7 F (36.5 C)     Temp Source 01/01/23 1303 Oral     SpO2 01/01/23 1303 99 %     Weight 01/01/23 1302 115 lb (52.2 kg)     Height 01/01/23 1302 5\' 2"  (1.575 m)     Head Circumference --      Peak Flow --      Pain Score 01/01/23 1306 7     Pain Loc --      Pain Education --      Exclude from Growth Chart --     Most recent vital signs: Vitals:   01/01/23 1303  BP: 111/77  Pulse: 86  Resp: 15  Temp: 97.7 F (36.5 C)  SpO2: 99%     General: Awake, no distress.  CV:  Good peripheral perfusion. RRR. Resp:  Normal effort. CTAB. Abd:  No distention.  Other:  CP is reproducible with palpation.   ED Results / Procedures / Treatments   Labs (all labs ordered are listed, but only abnormal results are displayed) Labs Reviewed  BASIC METABOLIC PANEL - Abnormal; Notable for the following components:      Result Value   Glucose, Bld 108 (*)    All other components within normal limits  CBC - Abnormal; Notable for the following components:   RBC 3.84 (*)    MCV 102.6 (*)    All other components within normal limits  TROPONIN I (HIGH SENSITIVITY)  TROPONIN I  (HIGH SENSITIVITY)     EKG  Provider interpretation: NSR with no ST changes Vent. rate 85 BPM PR interval 148 ms QRS duration 76 ms QT/QTcB 362/430 ms P-R-T axes 82 87 76   RADIOLOGY  Chest x-ray obtained, interpreted the images as well as reviewed the radiologist report which was negative for an acute cardiopulmonary abnormality but did show emphysema and foci of scarring within the right upper lobe and bilateral lung apices.   PROCEDURES:  Critical Care performed: No  Procedures   MEDICATIONS ORDERED IN ED: Medications  predniSONE (DELTASONE) tablet 60 mg (60 mg Oral Given 01/01/23 1534)     IMPRESSION / MDM / ASSESSMENT AND PLAN / ED COURSE  I reviewed the triage vital signs and the nursing notes.                             52 year old female presents for evaluation of cough and chest pain x  4 days.  VSS in triage and patient NAD on exam.  Differential diagnosis includes, but is not limited to, COPD exacerbation, costochondritis, bronchitis, ACS, PE.  Patient's presentation is most consistent with exacerbation of chronic illness.  Given patient's history of chest pain, cardiac workup was completed.  EKG showed NSR with no ST changes.  Chest x-ray obtained, interpreted the images as well as reviewed the radiologist report which showed emphysema but no acute cardiopulmonary abnormalities.  BMP and CBC unremarkable.  Troponin was not elevated.  Given patient's negative cardiac workup I believe chest pain is consistent with costochondritis as it is reproducible and she endorses doing some heavy lifting in the past few days.    PE unlikely based on patient's Wells score.    I also feel that she is having a COPD exacerbation as patient has had increased cough and sputum production in the past few days.  She does not warrant inpatient admission as her SpO2 is 99% on room air.  I provided some education around appropriate inhaler usage, I advised her to use the steroid  inhaler once a day as this is for maintenance and the albuterol inhaler only as needed.  Patient was doing the opposite before.  I will also prescribe her a short course of oral steroids and antibiotics.  Patient requested a referral for a pulmonologist and would also like a PCP referral as she has moved and would like one closer to her home.  Patient was agreeable to plan, voiced understanding and she was stable at discharge.      FINAL CLINICAL IMPRESSION(S) / ED DIAGNOSES   Final diagnoses:  COPD exacerbation (HCC)     Rx / DC Orders   ED Discharge Orders          Ordered    predniSONE (DELTASONE) 20 MG tablet  Daily with breakfast        01/01/23 1530    Ambulatory Referral to Primary Care (Establish Care)        01/01/23 1530    azithromycin (ZITHROMAX) 500 MG tablet  Daily        01/01/23 1536             Note:  This document was prepared using Dragon voice recognition software and may include unintentional dictation errors.   Cameron Ali, PA-C 01/01/23 1541    Janith Lima, MD 01/01/23 [phone]

## 2023-01-01 NOTE — ED Triage Notes (Signed)
Pt reports that she has been having pain that is left side under breast that wraps around to back under shoulder blade x 4 days with cough and sob.

## 2023-01-03 ENCOUNTER — Other Ambulatory Visit: Payer: Self-pay

## 2023-01-08 ENCOUNTER — Other Ambulatory Visit: Payer: Self-pay

## 2023-01-09 ENCOUNTER — Other Ambulatory Visit: Payer: Self-pay

## 2023-01-16 ENCOUNTER — Other Ambulatory Visit: Payer: Self-pay

## 2023-03-17 ENCOUNTER — Other Ambulatory Visit: Payer: Self-pay

## 2023-03-27 ENCOUNTER — Other Ambulatory Visit: Payer: Self-pay

## 2023-04-14 ENCOUNTER — Other Ambulatory Visit: Payer: Self-pay | Admitting: Nurse Practitioner

## 2023-04-14 ENCOUNTER — Other Ambulatory Visit: Payer: Self-pay | Admitting: Critical Care Medicine

## 2023-04-14 DIAGNOSIS — M5432 Sciatica, left side: Secondary | ICD-10-CM

## 2023-04-16 ENCOUNTER — Other Ambulatory Visit: Payer: Self-pay

## 2023-06-19 ENCOUNTER — Ambulatory Visit: Payer: Medicaid Other | Admitting: Physician Assistant

## 2023-07-22 ENCOUNTER — Encounter (HOSPITAL_COMMUNITY): Payer: Self-pay

## 2023-07-22 ENCOUNTER — Emergency Department (HOSPITAL_COMMUNITY)

## 2023-07-22 ENCOUNTER — Other Ambulatory Visit: Payer: Self-pay

## 2023-07-22 ENCOUNTER — Emergency Department (HOSPITAL_COMMUNITY)
Admission: EM | Admit: 2023-07-22 | Discharge: 2023-07-22 | Disposition: A | Discharge status: Home / Self Care | Attending: Emergency Medicine | Admitting: Emergency Medicine

## 2023-07-22 DIAGNOSIS — J449 Chronic obstructive pulmonary disease, unspecified: Secondary | ICD-10-CM | POA: Diagnosis not present

## 2023-07-22 DIAGNOSIS — F17200 Nicotine dependence, unspecified, uncomplicated: Secondary | ICD-10-CM | POA: Diagnosis not present

## 2023-07-22 DIAGNOSIS — J069 Acute upper respiratory infection, unspecified: Secondary | ICD-10-CM | POA: Insufficient documentation

## 2023-07-22 DIAGNOSIS — R059 Cough, unspecified: Secondary | ICD-10-CM | POA: Diagnosis present

## 2023-07-22 DIAGNOSIS — R0781 Pleurodynia: Secondary | ICD-10-CM

## 2023-07-22 LAB — RESP PANEL BY RT-PCR (RSV, FLU A&B, COVID)  RVPGX2
Influenza A by PCR: NEGATIVE
Influenza B by PCR: NEGATIVE
Resp Syncytial Virus by PCR: NEGATIVE
SARS Coronavirus 2 by RT PCR: NEGATIVE

## 2023-07-22 LAB — BASIC METABOLIC PANEL
Anion gap: 12 (ref 5–15)
BUN: 11 mg/dL (ref 6–20)
CO2: 24 mmol/L (ref 22–32)
Calcium: 9.9 mg/dL (ref 8.9–10.3)
Chloride: 103 mmol/L (ref 98–111)
Creatinine, Ser: 0.74 mg/dL (ref 0.44–1.00)
GFR, Estimated: >60 mL/min (ref >60)
Glucose, Bld: 100 mg/dL — ABNORMAL HIGH (ref 70–99)
Potassium: 3.7 mmol/L (ref 3.5–5.1)
Sodium: 139 mmol/L (ref 135–145)

## 2023-07-22 LAB — CBC WITH DIFFERENTIAL/PLATELET
Abs Immature Granulocytes: 0.02 10*3/uL (ref 0.00–0.07)
Basophils Absolute: 0 10*3/uL (ref 0.0–0.1)
Basophils Relative: 0 %
Eosinophils Absolute: 0.1 10*3/uL (ref 0.0–0.5)
Eosinophils Relative: 1 %
HCT: 37.9 % (ref 36.0–46.0)
Hemoglobin: 12.6 g/dL (ref 12.0–15.0)
Immature Granulocytes: 0 %
Lymphocytes Relative: 37 %
Lymphs Abs: 2.9 10*3/uL (ref 0.7–4.0)
MCH: 33.5 pg (ref 26.0–34.0)
MCHC: 33.2 g/dL (ref 30.0–36.0)
MCV: 100.8 fL — ABNORMAL HIGH (ref 80.0–100.0)
Monocytes Absolute: 0.4 10*3/uL (ref 0.1–1.0)
Monocytes Relative: 5 %
Neutro Abs: 4.3 10*3/uL (ref 1.7–7.7)
Neutrophils Relative %: 57 %
Platelets: 231 10*3/uL (ref 150–400)
RBC: 3.76 MIL/uL — ABNORMAL LOW (ref 3.87–5.11)
RDW: 12.9 % (ref 11.5–15.5)
WBC: 7.7 10*3/uL (ref 4.0–10.5)
nRBC: 0 % (ref 0.0–0.2)

## 2023-07-22 MED ORDER — KETOROLAC TROMETHAMINE 30 MG/ML IJ SOLN
30.0000 mg | Freq: Once | INTRAMUSCULAR | Status: AC
  Administered 2023-07-22: 30 mg via INTRAMUSCULAR
  Filled 2023-07-22: qty 1

## 2023-07-22 MED ORDER — PREDNISONE 20 MG PO TABS: 40.0000 mg | ORAL_TABLET | Freq: Every day | ORAL | 0 refills | Status: AC

## 2023-07-22 MED ORDER — DOXYCYCLINE HYCLATE 100 MG PO CAPS: 100.0000 mg | ORAL_CAPSULE | Freq: Two times a day (BID) | ORAL | 0 refills | Status: AC

## 2023-07-22 MED ORDER — DOXYCYCLINE HYCLATE 100 MG PO TABS
100.0000 mg | ORAL_TABLET | Freq: Once | ORAL | Status: AC
  Administered 2023-07-22: 100 mg via ORAL
  Filled 2023-07-22: qty 1

## 2023-07-22 MED ORDER — PREDNISONE 20 MG PO TABS
60.0000 mg | ORAL_TABLET | Freq: Once | ORAL | Status: AC
  Administered 2023-07-22: 60 mg via ORAL
  Filled 2023-07-22: qty 3

## 2023-07-22 NOTE — ED Triage Notes (Signed)
 Pt c/o cough, pain all over and fever for 3 weeks. Pt has had nausea, vomiting, and diarrhea as well. Pt states she has knots under left arm as well.

## 2023-07-22 NOTE — ED Provider Notes (Signed)
 [place] EMERGENCY DEPARTMENT AT [facility] Provider Note   CSN: [id number] Arrival date & time: 07/22/23  1314     History  Chief Complaint  Patient presents with   Cough    Lindsay Friedman is a 53 y.o. female.  Patient is a 53 year old female with a history of COPD, ongoing tobacco use, pneumothorax status post lung surgery who is presenting today with multiple complaints.  Patient reports that she has been sick for the last 3 weeks.  She has had cough, congestion.  Started with fever but the fever has since resolved.  However she is still not feeling back to baseline.  She has been wheezing intermittently which has helped some by her inhaler but today started having pain in both sides of the chest and in the ribs.  It is worse when she takes a deep breath.  Her inhaler is only helped a little bit with this.  Early on in her illness she was also having vomiting and diarrhea but that is since resolved as well.  She does note that her left armpit had lesions in it that one broke open and pus came out and it went away but there are 2 more.  The history is provided by the patient.  Cough      Home Medications Prior to Admission medications   Medication Sig Start Date End Date Taking? Authorizing Provider  doxycycline (VIBRAMYCIN) 100 MG capsule Take 1 capsule (100 mg total) by mouth 2 (two) times daily. 07/22/23  Yes Nikitas Davtyan, Alphonzo Lemmings, MD  predniSONE (DELTASONE) 20 MG tablet Take 2 tablets (40 mg total) by mouth daily. 07/22/23  Yes Gwyneth Sprout, MD  albuterol (VENTOLIN HFA) 108 (90 Base) MCG/ACT inhaler Inhale 2 puffs into the lungs every 6 (six) hours as needed for wheezing or shortness of breath. 12/12/21   Claiborne Rigg, NP  budesonide-formoterol (SYMBICORT) 80-4.5 MCG/ACT inhaler Inhale 2 puffs into the lungs 2 (two) times daily. 12/12/21   Claiborne Rigg, NP  clobetasol cream (TEMOVATE) 0.05 % Apply 1 Application topically 2 (two) times daily. 01/30/22   Storm Frisk, MD  gabapentin (NEURONTIN) 300 MG capsule Take 1 capsule (300 mg total) by mouth 3 (three) times daily. 09/27/22   Claiborne Rigg, NP  ondansetron (ZOFRAN-ODT) 4 MG disintegrating tablet Take 1 tablet (4 mg total) by mouth every 8 (eight) hours as needed for nausea or vomiting. 09/19/21   Claiborne Rigg, NP  pantoprazole (PROTONIX) 40 MG tablet Take 1 tablet (40 mg total) by mouth daily. 12/04/22   Hoy Register, MD      Allergies    Bee venom    Review of Systems   Review of Systems  Respiratory:  Positive for cough.     Physical Exam Updated Vital Signs BP 102/82   Pulse 77   Temp 97.7 F (36.5 C) (Oral)   Resp 10   Ht 5\' 2"  (1.575 m)   Wt 49.9 kg   SpO2 99%   BMI 20.12 kg/m  Physical Exam Vitals and nursing note reviewed.  Constitutional:      General: She is not in acute distress.    Appearance: She is well-developed.  HENT:     Head: Normocephalic and atraumatic.     Right Ear: There is impacted cerumen.     Left Ear: There is impacted cerumen.     Mouth/Throat:     Mouth: Mucous membranes are moist.  Eyes:     Pupils:  Pupils are equal, round, and reactive to light.  Cardiovascular:     Rate and Rhythm: Normal rate and regular rhythm.     Heart sounds: Normal heart sounds. No murmur heard.    No friction rub.  Pulmonary:     Effort: Pulmonary effort is normal.     Breath sounds: Normal breath sounds. No wheezing or rales.  Chest:     Chest wall: Tenderness present.  Abdominal:     General: Bowel sounds are normal. There is no distension.     Palpations: Abdomen is soft.     Tenderness: There is no abdominal tenderness. There is no guarding or rebound.  Musculoskeletal:        General: No tenderness. Normal range of motion.     Comments: No edema.  2 small areas of folliculitis in the axilla without significant abscess  Skin:    General: Skin is warm and dry.     Findings: No rash.  Neurological:     Mental Status: She is alert and oriented  to person, place, and time. Mental status is at baseline.     Cranial Nerves: No cranial nerve deficit.  Psychiatric:        Behavior: Behavior normal.     ED Results / Procedures / Treatments   Labs (all labs ordered are listed, but only abnormal results are displayed) Labs Reviewed  CBC WITH DIFFERENTIAL/PLATELET - Abnormal; Notable for the following components:      Result Value   RBC 3.76 (*)    MCV 100.8 (*)    All other components within normal limits  BASIC METABOLIC PANEL - Abnormal; Notable for the following components:   Glucose, Bld 100 (*)    All other components within normal limits  RESP PANEL BY RT-PCR (RSV, FLU A&B, COVID)  [id number]    EKG EKG Interpretation Date/Time:  Tuesday July 22 2023 19:03:19 EST Ventricular Rate:  88 PR Interval:  142 QRS Duration:  81 QT Interval:  374 QTC Calculation: 453 R Axis:   90  Text Interpretation: Sinus rhythm Normal ECG Borderline right axis deviation Confirmed by Gwyneth Sprout ([id number]) on 07/22/2023 7:15:15 PM  Radiology DG Chest 2 View Result Date: 07/22/2023 CLINICAL DATA:  Cough and fever for 3 weeks.  Vomiting and diarrhea. EXAM: CHEST - 2 VIEW COMPARISON:  01/01/2023 FINDINGS: The heart size and mediastinal contours are within normal limits. Stable bilateral upper lobe scarring. No evidence of pulmonary infiltrate or pleural effusion. The visualized skeletal structures are unremarkable. IMPRESSION: No active cardiopulmonary disease. Electronically Signed   By: Danae Orleans M.D.   On: 07/22/2023 16:59    Procedures Procedures    Medications Ordered in ED Medications  ketorolac (TORADOL) 30 MG/ML injection 30 mg (30 mg Intramuscular Given 07/22/23 1914)  doxycycline (VIBRA-TABS) tablet 100 mg (100 mg Oral Given 07/22/23 1914)  predniSONE (DELTASONE) tablet 60 mg (60 mg Oral Given 07/22/23 1914)    ED Course/ Medical Decision Making/ A&P                                 Medical Decision Making Amount and/or  Complexity of Data Reviewed Labs: ordered. Decision-making details documented in ED Course. Radiology: ordered and independent interpretation performed. Decision-making details documented in ED Course. ECG/medicine tests: ordered and independent interpretation performed. Decision-making details documented in ED Course.  Risk Prescription drug management.   Pt with multiple medical problems and comorbidities and  presenting today with a complaint that caries a high risk for morbidity and mortality.  Here today with URI symptoms and ongoing cough and chest pain.  Patient is low risk for PE.  Concern for pneumothorax, pneumonia, COPD exacerbation with pleurisy.  She has no abdominal findings today concerning for an acute abdominal pathology.  She also has evidence of folliculitis in her left axilla.  No abscesses that required drainage at this time. I independently interpreted patient's labs and EKG.  EKG is within normal limits, CBC, BMP and viral panel are negative.  I have independently visualized and interpreted pt's images today.  Chest x-ray within normal limits.  At this time feel that patient is stable for discharge home.  Sats are 99% on room air and patient is in no acute distress.  Will give doxycycline for the folliculitis in her axilla as well as prednisone for COPD.          Final Clinical Impression(s) / ED Diagnoses Final diagnoses:  Viral URI with cough  Pleuritic chest pain    Rx / DC Orders ED Discharge Orders          Ordered    doxycycline (VIBRAMYCIN) 100 MG capsule  2 times daily        07/22/23 1918    predniSONE (DELTASONE) 20 MG tablet  Daily        07/22/23 1918              Gwyneth Sprout, MD 07/22/23 1919

## 2023-07-22 NOTE — ED Provider Triage Note (Signed)
 Emergency Medicine Provider Triage Evaluation Note  Lindsay Friedman , a 53 y.o. female  was evaluated in triage.  Pt complains of cough, bumps under Left arm .  Onset of was 3 weeks ago.  Patient has a history of smoking states she quit smoking "about a month or 2 ago."  Boyfriend was positive for flu A when her symptoms began but she did not test.  Complains of persistent cough, pain around her lower rib cage, pain with deep breathing denies shortness of breath or hemoptysis.  Review of Systems  Positive: Cough Negative: Fever  Physical Exam  BP (!) 138/97 (BP Location: Right Arm)   Pulse (!) 102   Temp 98.2 F (36.8 C)   Resp 16   Ht 5\' 2"  (1.575 m)   Wt 49.9 kg   SpO2 98%   BMI 20.12 kg/m  Gen:   Awake, no distress   Resp:  Normal effort  MSK:   Moves extremities without difficulty  Other:    Medical Decision Making  Medically screening exam initiated at 2:24 PM.  Appropriate orders placed.  Lindsay Friedman was informed that the remainder of the evaluation will be completed by another provider, this initial triage assessment does not replace that evaluation, and the importance of remaining in the ED until their evaluation is complete.     Arthor Captain, PA-C 07/22/23 1425

## 2023-07-22 NOTE — Discharge Instructions (Signed)
 You do not need to take any of the prescription medications until tomorrow because you have had a dose here.  Continue to use your inhaler as needed.  It is okay to take Tylenol or ibuprofen tomorrow as needed for the pain.  If you start having high fever, cannot catch her breath despite these medications return to the emergency room.

## 2023-12-05 ENCOUNTER — Ambulatory Visit

## 2023-12-07 ENCOUNTER — Other Ambulatory Visit: Payer: Self-pay

## 2023-12-07 ENCOUNTER — Emergency Department
Admission: EM | Admit: 2023-12-07 | Discharge: 2023-12-07 | Disposition: A | Discharge status: Home / Self Care | Attending: Student | Admitting: Student

## 2023-12-07 ENCOUNTER — Encounter: Payer: Self-pay | Admitting: Emergency Medicine

## 2023-12-07 DIAGNOSIS — M545 Low back pain, unspecified: Secondary | ICD-10-CM | POA: Insufficient documentation

## 2023-12-07 DIAGNOSIS — B029 Zoster without complications: Secondary | ICD-10-CM | POA: Diagnosis not present

## 2023-12-07 DIAGNOSIS — J449 Chronic obstructive pulmonary disease, unspecified: Secondary | ICD-10-CM | POA: Insufficient documentation

## 2023-12-07 MED ORDER — ACETAMINOPHEN 325 MG PO TABS: 650.0000 mg | ORAL_TABLET | ORAL | 2 refills | Status: AC | PRN

## 2023-12-07 MED ORDER — IBUPROFEN 800 MG PO TABS: 800.0000 mg | ORAL_TABLET | Freq: Three times a day (TID) | ORAL | 2 refills | Status: AC | PRN

## 2023-12-07 MED ORDER — METHOCARBAMOL 500 MG PO TABS: 1000.0000 mg | ORAL_TABLET | Freq: Three times a day (TID) | ORAL | 0 refills | Status: AC

## 2023-12-07 MED ORDER — LIDOCAINE 5 % EX PTCH: 1.0000 | MEDICATED_PATCH | CUTANEOUS | 0 refills | Status: AC
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
## 2023-12-07 NOTE — Discharge Instructions (Addendum)
 You can take 650 mg of Tylenol  every 6 hours and 800 mg of ibuprofen  every 8 hours as needed for pain. You can use ice, heat, muscle creams and other topical pain relievers as well. I have sent some lidocaine  patches for you to use as well.  I have sent a muscle relaxer to your pharmacy. This can be taken every 8 hours as needed for muscle spasms. This medication is sedating, so do not drive for 8 hours after taking it.   Please go to the following website to schedule new (and existing) patient appointments:   [email or web address]   The following is a list of primary care offices in the area who are accepting new patients at this time.  Please reach out to one of them directly and let them know you would like to schedule an appointment to follow up on an Emergency Department visit, and/or to establish a new primary care provider (PCP).  There are likely other primary care clinics in the are who are accepting new patients, but this is an excellent place to start:  [facility] Lead physician: Dr Jon Eva [address] [place], [place] [address] [phone]  [facility] Lead Physician: Dr Dorette Loron [address], [place], [place] [address] [phone]  [facility]  Lead Physician: Dr Duwaine Louder [address] [place], [place] [address] [phone]  [facility] Lead Physician: Dr Marolyn Officer [address], [place], [place] [address] [phone]  [facility] at [facility] Lead Physician: Dr Leita Adie [address] [place], [place], [place] [address] [phone]
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
